# Patient Record
Sex: Male | Born: 1975 | Race: White | Hispanic: No | Marital: Married | Smoking: Never smoker
Health system: Southern US, Community
[De-identification: ages and names within clinical notes are randomized; demographics above are authoritative.]

## PROBLEM LIST (undated history)

## (undated) HISTORY — PX: BUNIONECTOMY: SHX129

## (undated) HISTORY — PX: HERNIA REPAIR: SHX51

## (undated) HISTORY — PX: TONSILLECTOMY: SUR1361

---

## 2005-03-12 ENCOUNTER — Emergency Department: Payer: Self-pay | Admitting: Emergency Medicine

## 2006-05-28 ENCOUNTER — Ambulatory Visit: Payer: Self-pay | Admitting: Family Medicine

## 2007-03-27 ENCOUNTER — Encounter (INDEPENDENT_AMBULATORY_CARE_PROVIDER_SITE_OTHER): Payer: Self-pay | Admitting: Internal Medicine

## 2007-03-27 ENCOUNTER — Ambulatory Visit: Payer: Self-pay | Admitting: Family Medicine

## 2007-03-28 ENCOUNTER — Ambulatory Visit: Payer: Self-pay | Admitting: Family Medicine

## 2007-03-29 LAB — CONVERTED CEMR LAB
Alkaline Phosphatase: 67 units/L (ref 39–117)
Bilirubin, Direct: 0.1 mg/dL (ref 0.0–0.3)
Mono Screen: NEGATIVE
Total Bilirubin: 0.8 mg/dL (ref 0.3–1.2)
Total Protein: 7.2 g/dL (ref 6.0–8.3)

## 2007-04-10 ENCOUNTER — Encounter (INDEPENDENT_AMBULATORY_CARE_PROVIDER_SITE_OTHER): Payer: Self-pay | Admitting: Internal Medicine

## 2007-05-13 ENCOUNTER — Ambulatory Visit: Payer: Self-pay | Admitting: Family Medicine

## 2007-05-13 DIAGNOSIS — K644 Residual hemorrhoidal skin tags: Secondary | ICD-10-CM | POA: Insufficient documentation

## 2007-07-12 LAB — CONVERTED CEMR LAB
Basophils Relative: 0.1 % (ref 0.0–1.0)
Eosinophils Absolute: 0.1 10*3/uL (ref 0.0–0.6)
Eosinophils Relative: 1.6 % (ref 0.0–5.0)
HCT: 45.9 % (ref 39.0–52.0)
Hemoglobin: 15.9 g/dL (ref 13.0–17.0)
Lymphocytes Relative: 39.8 % (ref 12.0–46.0)
MCV: 91 fL (ref 78.0–100.0)
Neutro Abs: 1.3 10*3/uL — ABNORMAL LOW (ref 1.4–7.7)
Neutrophils Relative %: 39.9 % — ABNORMAL LOW (ref 43.0–77.0)
Platelets: 250 10*3/uL (ref 150–400)
WBC: 3.3 10*3/uL — ABNORMAL LOW (ref 4.5–10.5)

## 2008-10-12 ENCOUNTER — Ambulatory Visit: Payer: Self-pay | Admitting: Family Medicine

## 2010-01-28 ENCOUNTER — Ambulatory Visit: Payer: Self-pay | Admitting: Family Medicine

## 2010-01-28 DIAGNOSIS — J019 Acute sinusitis, unspecified: Secondary | ICD-10-CM

## 2010-01-28 DIAGNOSIS — M549 Dorsalgia, unspecified: Secondary | ICD-10-CM | POA: Insufficient documentation

## 2010-03-10 ENCOUNTER — Telehealth: Payer: Self-pay | Admitting: Family Medicine

## 2010-11-15 NOTE — Assessment & Plan Note (Signed)
Summary: back and congestion/dlo   Vital Signs:  Patient profile:   35 year old male Height:      67.25 inches Weight:      158.25 pounds BMI:     24.69 Temp:     98.1 degrees F oral Pulse rate:   80 / minute Pulse rhythm:   regular BP sitting:   118 / 82  (left arm) Cuff size:   regular  Vitals Entered By: Lewanda Rife LPN (January 28, 2010 8:08 AM) CC: mid back pain below shoulder blades with head congestion and rt side of face hurts and teeth on rt side hurt, CHF Management   History of Present Illness: sinus symptoms started one week ago  runny nose and took claritin  got worse and worse- tried the D  now R side of face and teeth hurt and green nasal d/c started with cough-that is improved  no fever   also has back problem 3 weeks ago after 3rd shift went to bed  woke up with pain in mid back between shoulder blades had to crawl to the kitchen  took ibuprofen  progressively got better  now is just tight and worse on L  usually bending and extending  occ sore to take a really deep breath- that is a bit better this am    Allergies (verified): No Known Drug Allergies  Past History:  Social History: Last updated: 01/28/2010 Marital Status: Married--03/02/07 Children: 0--step daughter Occupation: Research officer, political party non smoker  Past Medical History: back pain  ext hemorroids   Social History: Marital Status: Married--03/02/07 Children: 0--step daughter Occupation: Research officer, political party non smoker  Review of Systems General:  Complains of fatigue and loss of appetite; denies chills and fever. Eyes:  Denies blurring and eye irritation. ENT:  Complains of earache, hoarseness, nasal congestion, postnasal drainage, sinus pressure, and sore throat. CV:  Denies chest pain or discomfort, lightheadness, and palpitations. Resp:  Denies cough and wheezing. GI:  Denies abdominal pain, change in bowel habits, indigestion, nausea, and vomiting. MS:  Complains of low back pain  and stiffness; denies joint swelling, loss of strength, cramps, and muscle weakness. Derm:  Denies itching, lesion(s), poor wound healing, and rash. Neuro:  Denies numbness, tingling, and weakness. Endo:  Denies excessive thirst and excessive urination. Heme:  Denies abnormal bruising and bleeding. Allergy:  Complains of seasonal allergies.  Physical Exam  General:  fatigued but well appearing  Head:   R max sinus tenderness normocephalic, atraumatic, and no abnormalities observed.   Eyes:  vision grossly intact, pupils equal, pupils round, pupils reactive to light, and no injection.   Ears:  R ear normal and L ear normal.   Nose:  nares are injected and congested bilaterally  Mouth:  pharynx pink and moist, no erythema, and no exudates.  with post nasal drip  Neck:  No deformities, masses, or tenderness noted. Chest Wall:  No deformities, masses, tenderness or gynecomastia noted. Lungs:  Normal respiratory effort, chest expands symmetrically. Lungs are clear to auscultation, no crackles or wheezes. Heart:  Normal rate and regular rhythm. S1 and S2 normal without gallop, murmur, click, rub or other extra sounds. Abdomen:  soft and non-tender.  no suprapubic tenderness or fullness felt  Msk:  nl rom neck and TS with no bony tenderness very tender in L  trap -- some pain to raise arm and twist  spasm noted  no LS pain / neg slr and nl rom hips  Extremities:  No clubbing, cyanosis,  edema, or deformity noted with normal full range of motion of all joints.   Neurologic:  strength normal in all extremities, sensation intact to light touch, gait normal, and DTRs symmetrical and normal.   Skin:  Intact without suspicious lesions or rashes Cervical Nodes:  No lymphadenopathy noted Psych:  normal affect, talkative and pleasant    Impression & Recommendations:  Problem # 1:  SINUSITIS - ACUTE-NOS (ICD-461.9) Assessment New s/p uri with facial pain and congestion tx with augmentin/ nasal  saline  update if not imp or if worse  The following medications were removed from the medication list:    Biaxin Xl 500 Mg Xr24h-tab (Clarithromycin) .Marland Kitchen... 2 by mouth once daily for 10 days    Guaifenesin-codeine 100-10 Mg/36ml Soln (Guaifenesin-codeine) .Marland Kitchen... 1-2 teaspoons by mouth up to 4-6 hours as needed cough His updated medication list for this problem includes:    Claritin-d 24 Hour 10-240 Mg Xr24h-tab (Loratadine-pseudoephedrine) .Marland Kitchen... Take 1 tablet by mouth once a day    Augmentin 875-125 Mg Tabs (Amoxicillin-pot clavulanate) .Marland Kitchen... 1 by mouth two times a day for 10 days  Problem # 2:  BACK PAIN (ICD-724.5) Assessment: New per exam - trapezius spasm that is improving with time recommended heat/ stretch/ibuprofen as needed  flexeril given with caution as needed  if not imp 1 wk consider PT or futher eval adv to update if any neurol symptoms  His updated medication list for this problem includes:    Ibuprofen 200 Mg Tabs (Ibuprofen) .Marland Kitchen... As needed    Flexeril 10 Mg Tabs (Cyclobenzaprine hcl) .Marland Kitchen... 1 by mouth up to three times a day as needed muscle spasm/ back pain  warn of sedation  Complete Medication List: 1)  Ibuprofen 200 Mg Tabs (Ibuprofen) .... As needed 2)  Claritin-d 24 Hour 10-240 Mg Xr24h-tab (Loratadine-pseudoephedrine) .... Take 1 tablet by mouth once a day 3)  Augmentin 875-125 Mg Tabs (Amoxicillin-pot clavulanate) .Marland Kitchen.. 1 by mouth two times a day for 10 days 4)  Flexeril 10 Mg Tabs (Cyclobenzaprine hcl) .Marland Kitchen.. 1 by mouth up to three times a day as needed muscle spasm/ back pain  warn of sedation  CHF Assessment/Plan:      The patient's current weight is 158.25 pounds.  His previous weight was 154 pounds.    Patient Instructions: 1)  continue claritin D if it helps 2)  use nasal saline spray or netti pot to flush sinuses  3)  use heat on back  4)  try flexeril - warn it may sedate  5)  ibuprofen is ok with food 6)  update me in 1 week if not improved or earlier if  worse 7)  I sent you px to pharmacy  Prescriptions: FLEXERIL 10 MG TABS (CYCLOBENZAPRINE HCL) 1 by mouth up to three times a day as needed muscle spasm/ back pain  warn of sedation  #30 x 0   Entered and Authorized by:   Judith Part MD   Signed by:   Judith Part MD on 01/28/2010   Method used:   Electronically to        [facility] [address] (retail)       [address]       [place], [place]  [address]       Ph: [phone]       Fax: [phone]   RxID:   [id number] AUGMENTIN 875-125 MG TABS (AMOXICILLIN-POT CLAVULANATE) 1 by mouth two times  a day for 10 days  #20 x 0   Entered and Authorized by:   Judith Part MD   Signed by:   Judith Part MD on 01/28/2010   Method used:   Electronically to        [facility] [address] (retail)       [address]       [place], [place]  [address]       Ph: [phone]       Fax: [phone]   RxID:   [id number]   Current Allergies (reviewed today): No known allergies

## 2010-11-15 NOTE — Progress Notes (Signed)
Summary: ? ABX refill  Phone Note Call from Patient Call back at Home Phone [phone]   Caller: Patient Call For: Dr. Milinda Antis Summary of Call: Patient says he was treated by you with Augmentin last month for a sinus infection.  He got better but still experienced some sinus pain and pressure from time to time.  Now, it is exacerbating again.  He is asking if you can refill the ABX.  I explained that it has been a month and that you likely will need him to be seen by someone here at the clinic but he asked that I send this note.  [facility] [place]  Phone:   [phone] Initial call taken by: Delilah Shan CMA Duncan Dull),  Mar 10, 2010 10:01 AM  Follow-up for Phone Call        if it has been a month- needs to be seen please  Follow-up by: Judith Part MD,  Mar 10, 2010 12:22 PM  Additional Follow-up for Phone Call Additional follow up Details #1::        Wife advised. Additional Follow-up by: Delilah Shan CMA Duncan Dull),  Mar 10, 2010 2:13 PM

## 2011-03-03 NOTE — Assessment & Plan Note (Signed)
[facility] OFFICE NOTE   Henry, [name]                        MRN:          [id number]  DATE:05/28/2006                            DOB:          [birth year]    Henry [name] is a 35 year old white male who comes to establish with the  practice due to upper respiratory symptoms, reflux, RLS, IBS, and weight  loss.   We discussed the problems that he wished to address first, the first being a  URI, onset approximately one week,  with fever and fatigue.  He has worsened  now with nasal congestion.  He has been taking Thera-flu which is of no  help.   He also indicates that he has had reflux, has not been using anything for  this except Gaviscon which brings very little relief.  He also indicates a  history of IBS and weight loss which will be addressed at other office visit  as will his question of restless legs.  He also has warts on both hands.   He has previously been seen at [facility] by Dr. Talmage Coin.  He is  switching practices.   CURRENT MEDICATIONS:  No chronic medications.  He is using Thera-Flu and  Gaviscon on an as needed basis.   ALLERGIES:  None known.   PAST MEDICAL HISTORY:  Significant for chickenpox, [place] measles and mumps,  reflux, and IBS.   SURGERIES:  Have included a repair of a right hernia between the ages of 3  and 4.  T&A between the ages of 3-6 and ear tubes as a younger child.  He  has not been hospitalized.   SOCIAL HISTORY:  He is engaged.  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  He is in no exercise program, has never smoked,  occasionally uses alcohol and does not use street drugs.   REVIEW OF SYSTEMS:  He denies any HEENT, cardiovascular, respiratory, GU  problems.  GI:  He has had IBS with diarrhea dominant.  No scoping.  He has  reflux which causes hoarseness and is having moderate improvement with  Gaviscon.  He has had no  transfusions and no tattoos.  He does note a 10  pound weight loss in the past year and indicates that he eats lots of food  and has for years.  MUSCULOSKELETAL:  He has a fracture of the right thumb  and questionable RLS.  He has some problems with allergies in the spring and  fall and no thromboses.   FAMILY HISTORY:  Father died at age 34 of cancer of the lung with  metastasis.  He had been a smoker.  He also had an MI at age 65 and had  bypass surgery.  Mother is living at age 50.  She had an MI at age 67, was a  smoker, hypertension, breast cancer, bypass surgery and depression.  His  paternal grandmother is living and is obese.  Paternal grandfather is living  at age 26 having had an MI and stent.  Both  of his maternal grandparents  have died, reason is not known.  He has one brother age 63 who died in a  motor vehicle accident.   Questions regarding the extended family found that a paternal grandfather  has had an MI and stents as noted above.  Diabetes in one maternal cousin.  To his knowledge there is no cancer, drug or alcohol abuse in the family.   IMMUNIZATION RECORDS:  His last tetanus was in 2006.  He has not had the  hepatitis B vaccine.   PHYSICAL EXAMINATION:  VITAL SIGNS:  Blood pressure 129/85.  Temperature  97.7.  Pulse is 62.  Weight is 148.  Height 5 feet 6-1/2 inches.  GENERAL:  Thin white male in no acute distress.  HEENT:  TMs are retracted bilaterally.  Fluid present.  Nasal mucosa is  erythematous and edematous.  Posterior pharynx is injected.  Sinuses are  minimally tender.  NECK:  Without lymphadenopathy.  CHEST:  Clear throughout.  No rales or rhonchi or wheezes.  HEART:  Rate and rhythm regular without murmurs, gallops or rubs.  ABDOMEN:  Soft and flat without masses.  He has minimal tenderness in the  mid-epigastric region.  No organomegaly including liver and spleen.  Bowel  sounds are heard throughout.  RECTAL:  Deferred at this time.   MUSCULOSKELETAL:  Muscle mass is equal upper and lower extremities.  Strength is 5/5 upper and lower extremities.  Gait and station within normal  limits.  SKIN:  Without obvious lesion in exposed areas.  PSYCHIATRIC:  Oriented x3.  Verbalizes easily.   ASSESSMENT:  1. Upper respiratory infection.  Will try him on Clarinex 5 mg one a day.      Samples were given and then move to Claritin over-the-counter if he      needs any more coverage.  Increase p.o. fluids and see back if not      improved in 7-10 days.  2. Gastroesophageal reflux disease.  I have asked him to move to Prilosec      20 mg one a day.  I have given him samples and he will obtain it over-      the-counter.  If he is not improved he will increase it to 40 mg a day.  3. History of irritable bowel syndrome which he indicates is stable at the      present time.  4. Restless leg syndrome which was not discussed today but will when he      returns.  5. Warts on his hands.  Will treat these on the next visit.                                  Billie D. Jillyn Hidden, FNP                                Arta Silence, MD   BDB/MedQ  DD:  06/04/2006  DT:  06/04/2006  Job #:  [id number]

## 2013-05-18 ENCOUNTER — Telehealth: Payer: Self-pay | Admitting: Family Medicine

## 2013-05-18 DIAGNOSIS — Z0000 Encounter for general adult medical examination without abnormal findings: Secondary | ICD-10-CM | POA: Insufficient documentation

## 2013-05-18 NOTE — Telephone Encounter (Signed)
Message copied by Judy Pimple on Sun May 18, 2013 11:30 PM ------      Message from: Alvina Chou      Created: Tue May 06, 2013 11:25 AM      Regarding: Lab orders for Monday, 8.4.14       Patient is scheduled for CPX labs, please order future labs, Thanks , Terri       ------

## 2013-05-19 ENCOUNTER — Other Ambulatory Visit (INDEPENDENT_AMBULATORY_CARE_PROVIDER_SITE_OTHER): Payer: BC Managed Care – PPO

## 2013-05-19 DIAGNOSIS — R7989 Other specified abnormal findings of blood chemistry: Secondary | ICD-10-CM

## 2013-05-19 DIAGNOSIS — Z0000 Encounter for general adult medical examination without abnormal findings: Secondary | ICD-10-CM

## 2013-05-19 LAB — CBC WITH DIFFERENTIAL/PLATELET
Basophils Absolute: 0 10*3/uL (ref 0.0–0.1)
HCT: 42.9 % (ref 39.0–52.0)
Lymphs Abs: 2.5 10*3/uL (ref 0.7–4.0)
MCV: 91.2 fl (ref 78.0–100.0)
Monocytes Absolute: 0.7 10*3/uL (ref 0.1–1.0)
Monocytes Relative: 7.4 % (ref 3.0–12.0)
Neutrophils Relative %: 65 % (ref 43.0–77.0)
Platelets: 253 10*3/uL (ref 150.0–400.0)
RDW: 12.5 % (ref 11.5–14.6)

## 2013-05-19 LAB — COMPREHENSIVE METABOLIC PANEL
ALT: 31 U/L (ref 0–53)
Alkaline Phosphatase: 67 U/L (ref 39–117)
Potassium: 3.8 mEq/L (ref 3.5–5.1)
Sodium: 138 mEq/L (ref 135–145)
Total Bilirubin: 0.8 mg/dL (ref 0.3–1.2)
Total Protein: 7.6 g/dL (ref 6.0–8.3)

## 2013-05-19 LAB — LIPID PANEL
Cholesterol: 206 mg/dL — ABNORMAL HIGH (ref 0–200)
HDL: 41.6 mg/dL (ref >39.00)
Total CHOL/HDL Ratio: 5
Triglycerides: 170 mg/dL — ABNORMAL HIGH (ref 0.0–149.0)
VLDL: 34 mg/dL (ref 0.0–40.0)

## 2013-05-26 ENCOUNTER — Encounter: Payer: Self-pay | Admitting: Family Medicine

## 2013-05-26 ENCOUNTER — Ambulatory Visit (INDEPENDENT_AMBULATORY_CARE_PROVIDER_SITE_OTHER): Payer: BC Managed Care – PPO | Admitting: Family Medicine

## 2013-05-26 VITALS — BP 118/78 | HR 66 | Temp 98.4°F | Ht 67.25 in | Wt 173.5 lb

## 2013-05-26 DIAGNOSIS — E785 Hyperlipidemia, unspecified: Secondary | ICD-10-CM

## 2013-05-26 DIAGNOSIS — Z0000 Encounter for general adult medical examination without abnormal findings: Secondary | ICD-10-CM

## 2013-05-26 NOTE — Assessment & Plan Note (Signed)
Reviewed health habits including diet and exercise and skin cancer prevention Also reviewed health mt list, fam hx and immunizations  Rev wellness labs in detail  Rev some changes in diet that would be helpful ie: getting rid of sugar beverages
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
## 2013-05-26 NOTE — Assessment & Plan Note (Signed)
Disc goals for lipids and reasons to control them Rev labs with pt Rev low sat fat diet in detail   

## 2013-05-26 NOTE — Patient Instructions (Addendum)
You have mild high cholesterol Avoid red meat/ fried foods/ egg yolks/ fatty breakfast meats/ butter, cheese and high fat dairy/ and shellfish   Also get as much exercise as you can and try to lay off the sugar drinks and increase water intake Get a flu shot every fall

## 2013-08-21 ENCOUNTER — Other Ambulatory Visit: Payer: Self-pay

## 2014-10-30 ENCOUNTER — Encounter: Payer: Self-pay | Admitting: Podiatry

## 2014-10-30 ENCOUNTER — Ambulatory Visit (INDEPENDENT_AMBULATORY_CARE_PROVIDER_SITE_OTHER): Payer: BLUE CROSS/BLUE SHIELD | Admitting: Podiatry

## 2014-10-30 ENCOUNTER — Ambulatory Visit (INDEPENDENT_AMBULATORY_CARE_PROVIDER_SITE_OTHER): Payer: BLUE CROSS/BLUE SHIELD

## 2014-10-30 VITALS — BP 130/83 | HR 64 | Resp 16 | Ht 69.0 in | Wt 170.0 lb

## 2014-10-30 DIAGNOSIS — M21612 Bunion of left foot: Secondary | ICD-10-CM

## 2014-10-30 DIAGNOSIS — M2012 Hallux valgus (acquired), left foot: Secondary | ICD-10-CM

## 2014-10-30 DIAGNOSIS — M779 Enthesopathy, unspecified: Secondary | ICD-10-CM

## 2014-10-30 NOTE — Progress Notes (Signed)
Subjective:     Patient ID: Henry [name], male   DOB: 10/05/1976, 39 y.o.   MRN: [id number]  HPI patient presents with very painful bunion deformity of the left foot of close two-year duration with significant family history of this condition and his mother having both bunions fixed by me in the last few years. States he's having trouble wearing shoes or being able to ambulate and is also walking differently and is developing pain on the outside of his foot   Review of Systems  All other systems reviewed and are negative.      Objective:   Physical Exam  Constitutional: He is oriented to person, place, and time.  Cardiovascular: Intact distal pulses.   Musculoskeletal: Normal range of motion.  Neurological: He is oriented to person, place, and time.  Skin: Skin is warm.  Nursing note and vitals reviewed.  neurovascular status found to be intact with muscle strength adequate and range of motion subtalar midtarsal joint within normal limits. 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 Due to long-standing nature deformity increased pain and significant family history I do think that this would be best taken care of surgically and I reviewed Austin osteotomy left to be performed. Patient wants surgery and wants to do consent form now due to his schedule and at this time I allowed him to read a consent form line byline explaining surgery and all possible complications as outlined. He understands  risk wants surgery signed consent form and is given preoperative instructions sheet along with cam walker that I want him to get used to prior to surgery. He is encouraged to call if she stepped any questions prior to the procedure and also I discussed the outside of the foot which is most likely related to change in gait which should settle down once the bunion is correct

## 2014-10-30 NOTE — Progress Notes (Signed)
   Subjective:    Patient ID: Henry [name], male    DOB: 03/26/1976, 39 y.o.   MRN: [id number]  HPI Comments: i have a bunion on my left foot. Both sides of the left foot hurt. Its been hurting for 6-8 months. Its getting worse. i have bought some something off line to straighten my toe up. It hurts to walk, stand, and wear shoes sometimes.   Foot Pain      Review of Systems  All other systems reviewed and are negative.      Objective:   Physical Exam        Assessment & Plan:

## 2014-11-05 DIAGNOSIS — M2012 Hallux valgus (acquired), left foot: Secondary | ICD-10-CM

## 2014-11-24 DIAGNOSIS — M2012 Hallux valgus (acquired), left foot: Secondary | ICD-10-CM

## 2014-12-01 ENCOUNTER — Ambulatory Visit (INDEPENDENT_AMBULATORY_CARE_PROVIDER_SITE_OTHER): Payer: BLUE CROSS/BLUE SHIELD

## 2014-12-01 ENCOUNTER — Encounter: Payer: BLUE CROSS/BLUE SHIELD | Admitting: Podiatry

## 2014-12-01 ENCOUNTER — Encounter: Payer: Self-pay | Admitting: Podiatry

## 2014-12-01 ENCOUNTER — Ambulatory Visit (INDEPENDENT_AMBULATORY_CARE_PROVIDER_SITE_OTHER): Payer: BLUE CROSS/BLUE SHIELD | Admitting: Podiatry

## 2014-12-01 VITALS — BP 133/89 | HR 78 | Resp 16

## 2014-12-01 DIAGNOSIS — M2012 Hallux valgus (acquired), left foot: Secondary | ICD-10-CM

## 2014-12-02 NOTE — Progress Notes (Signed)
Subjective:     Patient ID: Henry [name], male   DOB: 01-Nov-1975, 39 y.o.   MRN: [id number]  HPI patient states that I'm doing really well with my foot with minimal discomfort and able to walk without swelling or minimal pain   Review of Systems     Objective:   Physical Exam Neurovascular status intact with muscle strength adequate range of motion within normal limits and noted to have well-healing first metatarsal site with wound edges well coapted and good alignment of the first metatarsal    Assessment:     Doing well post Austin osteotomy of the left foot with pin fixation    Plan:     H&P and conditions discussed. Today I went ahead and reviewed x-ray and advised on continued immobilization compression and elevation. Reappoint in 3 weeks or earlier if any issues should occur. Reapplied sterile dressing

## 2014-12-04 ENCOUNTER — Encounter: Payer: BLUE CROSS/BLUE SHIELD | Admitting: Podiatry

## 2014-12-25 ENCOUNTER — Encounter: Payer: Self-pay | Admitting: Podiatry

## 2014-12-25 ENCOUNTER — Ambulatory Visit (INDEPENDENT_AMBULATORY_CARE_PROVIDER_SITE_OTHER): Payer: BLUE CROSS/BLUE SHIELD | Admitting: Podiatry

## 2014-12-25 ENCOUNTER — Ambulatory Visit (INDEPENDENT_AMBULATORY_CARE_PROVIDER_SITE_OTHER): Payer: BLUE CROSS/BLUE SHIELD

## 2014-12-25 VITALS — BP 126/69 | HR 84 | Resp 16

## 2014-12-25 DIAGNOSIS — M2012 Hallux valgus (acquired), left foot: Secondary | ICD-10-CM

## 2014-12-25 DIAGNOSIS — R609 Edema, unspecified: Secondary | ICD-10-CM

## 2014-12-25 NOTE — Progress Notes (Signed)
Subjective:     Patient ID: Henry [name], male   DOB: 09-Jul-1976, 39 y.o.   MRN: [id number]  HPI patient presents stating that he is doing well but still getting some swelling and he's not sure about the range of motion. He still wearing his surgical shoe but is able to walk without significant discomfort   Review of Systems     Objective:   Physical Exam Neurovascular status intact with muscle strength adequate range of motion within normal limits. Patient's noted to have good healing incision site left first metatarsal with wound edges coapted well and hallux in rectus position. There is mild restriction of dorsiflexion but it is adequate at this time for this. Postop    Assessment:     Doing well with continued healing still progressing    Plan:     Reviewed condition and at this time dispensed a above ankle Tri-Lock brace in order to provide edema control and then I connected it to a distal device to lower the hallux and keep it stretched area reappoint to recheck

## 2015-01-22 ENCOUNTER — Ambulatory Visit (INDEPENDENT_AMBULATORY_CARE_PROVIDER_SITE_OTHER): Payer: BLUE CROSS/BLUE SHIELD

## 2015-01-22 ENCOUNTER — Ambulatory Visit (INDEPENDENT_AMBULATORY_CARE_PROVIDER_SITE_OTHER): Payer: BLUE CROSS/BLUE SHIELD | Admitting: Podiatry

## 2015-01-22 ENCOUNTER — Encounter: Payer: Self-pay | Admitting: Podiatry

## 2015-01-22 VITALS — BP 135/84 | HR 78 | Resp 16

## 2015-01-22 DIAGNOSIS — M2012 Hallux valgus (acquired), left foot: Secondary | ICD-10-CM

## 2015-01-24 NOTE — Progress Notes (Signed)
Subjective:     Patient ID: Henry BentonJames O Crafts II, male   DOB: 1976/01/28, 39 y.o.   MRN: [id number]  HPI patient states I get the shocking sensations the time but overall my foot is doing well and the pins are intact and the structural alignment looks good   Review of Systems     Objective:   Physical Exam Neurovascular status intact negative Homans sign noted with excellent alignment of the left forefoot secondary to reconstruction. Pins intact in the lesser digits and the toes are straight and not elevated and the wound edges are all well coapted on the foot    Assessment:     Doing well post forefoot reconstruction left    Plan:     Reviewed x-rays and remove pins from the toes and applied sterile dressings. Continue with range of motion exercises and gradual return soft shoe gear in the next week and dispensed anklet with instructions on usage. Reappoint one month and less she needs to be seen earlier

## 2015-03-25 ENCOUNTER — Telehealth: Payer: Self-pay | Admitting: *Deleted

## 2015-03-25 NOTE — Telephone Encounter (Signed)
no

## 2015-03-25 NOTE — Telephone Encounter (Addendum)
Pt states he had a bunion procedure 3 months ago and was wondering if he needed to be seen for the last appt.  Pt states he is doing a great.  I left message informing pt of Dr. Beverlee Nims statement.

## 2015-03-26 ENCOUNTER — Ambulatory Visit: Payer: BLUE CROSS/BLUE SHIELD | Admitting: Podiatry

## 2015-04-23 ENCOUNTER — Ambulatory Visit (INDEPENDENT_AMBULATORY_CARE_PROVIDER_SITE_OTHER): Payer: BLUE CROSS/BLUE SHIELD | Admitting: Family Medicine

## 2015-04-23 ENCOUNTER — Encounter: Payer: Self-pay | Admitting: Family Medicine

## 2015-04-23 ENCOUNTER — Ambulatory Visit (INDEPENDENT_AMBULATORY_CARE_PROVIDER_SITE_OTHER)
Admission: RE | Admit: 2015-04-23 | Discharge: 2015-04-23 | Disposition: A | Discharge status: Home / Self Care | Payer: BLUE CROSS/BLUE SHIELD | Source: Ambulatory Visit | Attending: Family Medicine | Admitting: Family Medicine

## 2015-04-23 VITALS — BP 136/74 | HR 60 | Temp 98.3°F | Ht 67.25 in | Wt 177.0 lb

## 2015-04-23 DIAGNOSIS — J209 Acute bronchitis, unspecified: Secondary | ICD-10-CM | POA: Insufficient documentation

## 2015-04-23 MED ORDER — PREDNISONE 10 MG PO TABS: ORAL_TABLET | ORAL | Status: DC

## 2015-04-23 MED ORDER — ALBUTEROL SULFATE HFA 108 (90 BASE) MCG/ACT IN AERS: 2.0000 | INHALATION_SPRAY | RESPIRATORY_TRACT | Status: DC | PRN

## 2015-04-23 MED ORDER — BENZONATATE 200 MG PO CAPS: 200.0000 mg | ORAL_CAPSULE | Freq: Three times a day (TID) | ORAL | Status: DC | PRN

## 2015-04-23 NOTE — Progress Notes (Signed)
Subjective:    Patient ID: Henry [name], male    DOB: [birth year], 39 y.o.   MRN: [id number]  HPI Here with uri symptoms  Started 4 weeks ago   First day - he had a fever and malaise and no appetite  Night sweats and chills Then nagging dry cough -- non productive  (was at the beach)  Felt lousy -one day to the next  Went to the urgent care 6/29 -- (son was sick too)   Given zpak  Dx with bronchitis  Did not get a very complete examination (only listened to lungs)   Chest hurts in one spot - esp with cough and sneezing (under R breast area)  Tight in his chest (perhaps a bit of wheezing at night)  Taking 800 mg ibuprofen  tussionex for cough - helped for 2 days (knocks him out and then cannot go back to sleep)   Still has a cough- calming down a bit   Son still has chest congestion - given prednisone and albuterol   Not having any nasal or ear or throat symptoms   Of note has had a leak in pipes at home with wet carpet etc. (3 weeks)  ? If poss mold    Patient Active Problem List   Diagnosis Date Noted  . Acute bronchitis 04/23/2015  . 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 Routine general medical examination at a health care facility 05/18/2013  . HEMORRHOIDS, EXTERNAL 05/13/2007   No past medical history on file. No past surgical history on file. History  Substance Use Topics  . Smoking status: Never Smoker   . Smokeless tobacco: Not on file  . Alcohol Use: 0.0 oz/week    0 Standard drinks or equivalent per week     Comment: weekly   No family history on file. No Known Allergies No current outpatient prescriptions on file prior to visit.   No current facility-administered medications on file prior to visit.    Review of Systems    Review of Systems  Constitutional: Negative for fever, appetite change,  and unexpected weight change.  Eyes: Negative for pain and visual disturbance.  ENT pos for rhinorrhea and drip  Respiratory: pos for cough/ tight  chest and wheeze  pos for chest wall pain  Cardiovascular: Negative for  palpitations    Gastrointestinal: Negative for nausea, diarrhea and constipation.  Genitourinary: Negative for urgency and frequency.  Skin: Negative for pallor or rash   Neurological: Negative for weakness, light-headedness, numbness and headaches.  Hematological: Negative for adenopathy. Does not bruise/bleed easily.  Psychiatric/Behavioral: Negative for dysphoric mood. The patient is not nervous/anxious.      Objective:   Physical Exam  Constitutional: He appears well-developed and well-nourished. No distress.  HENT:  Head: Normocephalic and atraumatic.  Right Ear: External ear normal.  Left Ear: External ear normal.  Mouth/Throat: Oropharynx is clear and moist.  Nares are boggy No sinus tenderness Clear rhinorrhea and post nasal drip   Eyes: Conjunctivae and EOM are normal. Pupils are equal, round, and reactive to light. Right eye exhibits no discharge. Left eye exhibits no discharge.  Neck: Normal range of motion. Neck supple.  Cardiovascular: Normal rate and normal heart sounds.   Pulmonary/Chest: Effort normal. No respiratory distress. He has wheezes. He has no rales. He exhibits tenderness.  Harsh bs Some end exp wheezes-though air exch is overall good Hacking cough  Tender CW under R breast area w/o crepitus or skin change   Lymphadenopathy:  He has no cervical adenopathy.  Neurological: He is alert.  Skin: Skin is warm and dry. No rash noted.  Psychiatric: He has a normal mood and affect.          Assessment & Plan:   Problem List Items Addressed This Visit    Acute bronchitis - Primary    With cyclic cough Will tx with hydrocodone cough med and tessalon to stop cycle  Albuterol prn for wheeze and prednisone for inflammation/ tightness Will watch out for fever or more prod cough  Update if not starting to improve in a week or if worsening        Relevant Orders   DG Chest 2 View  (Completed)

## 2015-04-23 NOTE — Progress Notes (Signed)
Pre visit review using our clinic review tool, if applicable. No additional management support is needed unless otherwise documented below in the visit note. 

## 2015-04-23 NOTE — Patient Instructions (Signed)
Try tessalon for cough three times daily  You can still take the hydrocodone cough syrup  Take the prednisone as directed - for inflammation in chest/lungs and tightness (this will also help pain)  Try a warm compress on chest also  Albuterol inhaler as needed for wheeze /tight chest - see the instructions I printed

## 2015-04-25 NOTE — Assessment & Plan Note (Signed)
With cyclic cough Will tx with hydrocodone cough med and tessalon to stop cycle  Albuterol prn for wheeze and prednisone for inflammation/ tightness Will watch out for fever or more prod cough  Update if not starting to improve in a week or if worsening

## 2016-03-06 IMAGING — CR DG CHEST 2V
2 series · 2 of 2 positions shown · non-contrast
Comparison: None.

CLINICAL DATA: Nonproductive cough and chest pain for 4 weeks.

EXAM:
CHEST  2 VIEW

[view not recorded (1 of 2)]
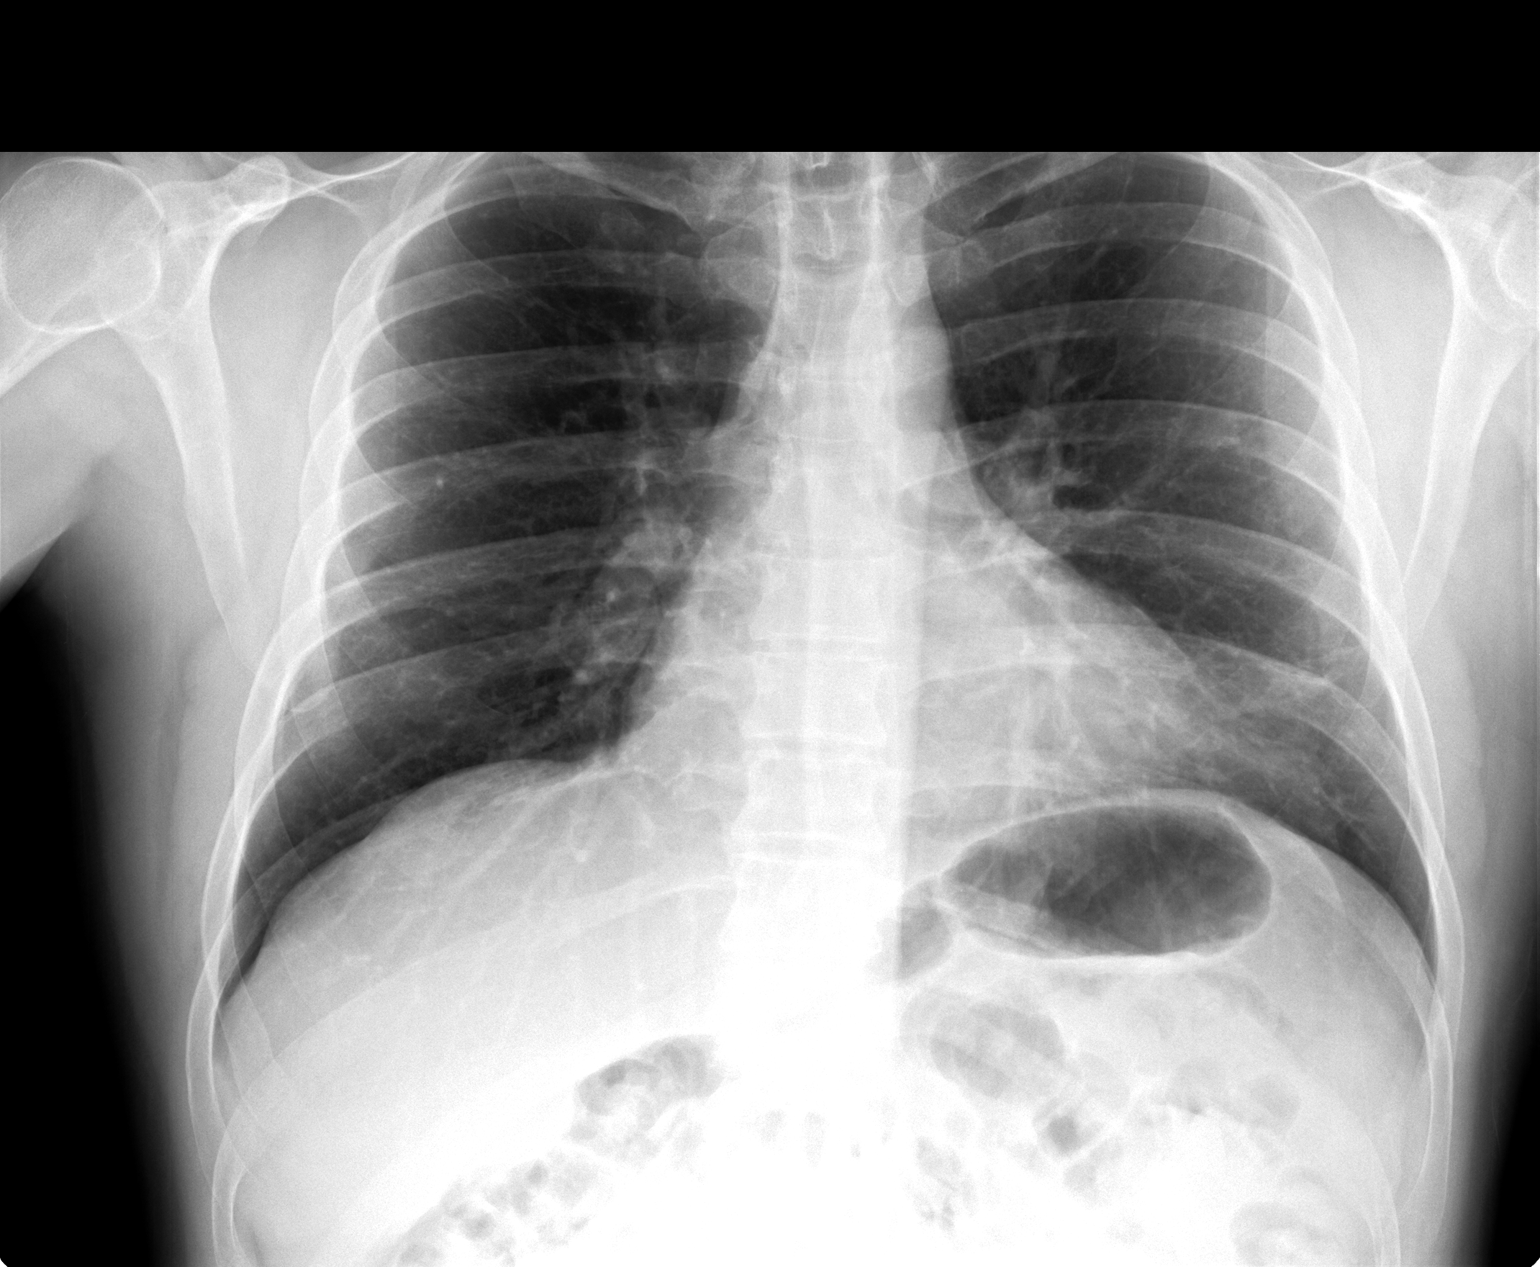

[view not recorded (2 of 2)]
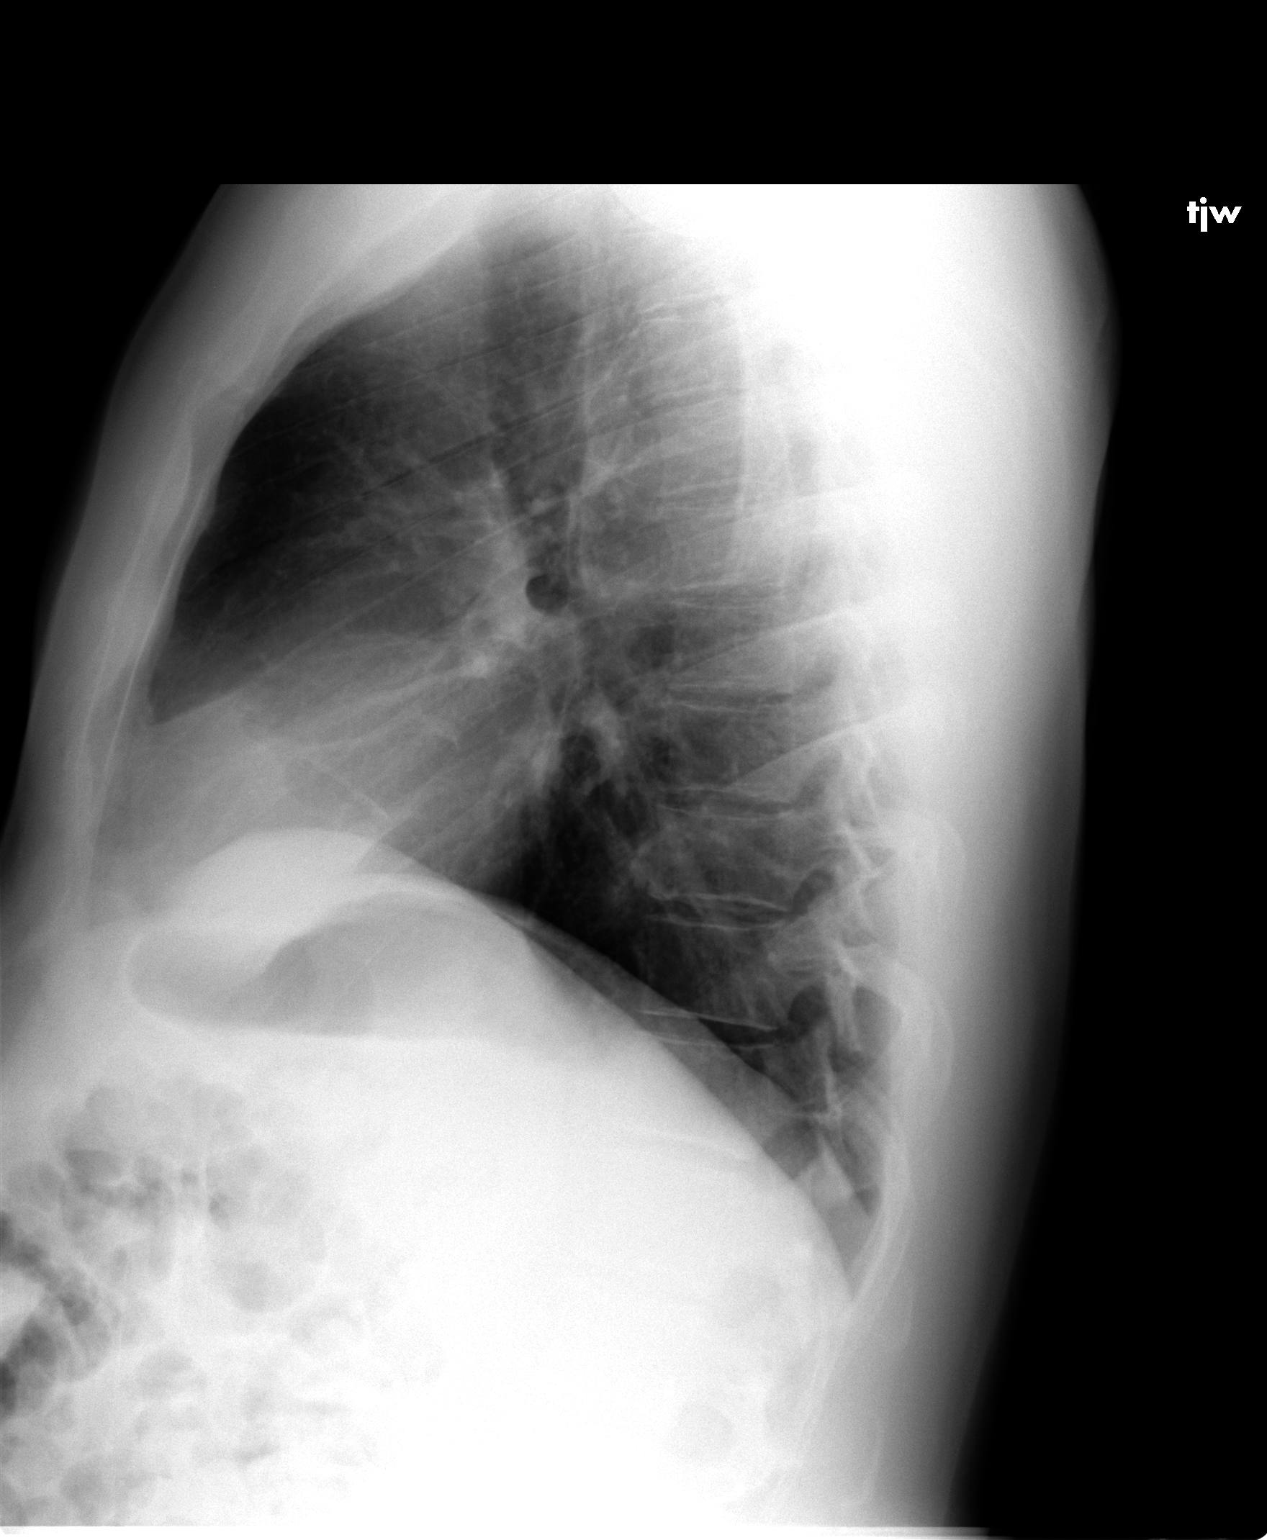

[2 of 2 positions shown; findings below may reference images not displayed]

FINDINGS: The cardiomediastinal silhouette is unremarkable.

Subsegmental atelectasis/scar in the left lower lung noted.

There is no evidence of focal airspace disease, pulmonary edema,
suspicious pulmonary nodule/mass, pleural effusion, or pneumothorax.
No acute bony abnormalities are identified.
IMPRESSION: No acute cardiopulmonary disease.

Subsegmental left lower lung atelectasis/scar.

## 2017-03-29 ENCOUNTER — Ambulatory Visit (INDEPENDENT_AMBULATORY_CARE_PROVIDER_SITE_OTHER): Payer: BLUE CROSS/BLUE SHIELD | Admitting: Family Medicine

## 2017-03-29 ENCOUNTER — Encounter: Payer: Self-pay | Admitting: Family Medicine

## 2017-03-29 VITALS — BP 124/66 | HR 63 | Temp 98.5°F | Wt 185.5 lb

## 2017-03-29 DIAGNOSIS — R05 Cough: Secondary | ICD-10-CM

## 2017-03-29 DIAGNOSIS — R059 Cough, unspecified: Secondary | ICD-10-CM

## 2017-03-29 MED ORDER — GUAIFENESIN-CODEINE 100-10 MG/5ML PO SYRP: 5.0000 mL | ORAL_SOLUTION | Freq: Two times a day (BID) | ORAL | 0 refills | Status: DC | PRN

## 2017-03-29 MED ORDER — FLUTICASONE PROPIONATE 50 MCG/ACT NA SUSP: 2.0000 | Freq: Every day | NASAL | 1 refills | Status: DC

## 2017-03-29 NOTE — Patient Instructions (Signed)
No signs of infection I think you have either allergic cough or reflux cough Treat with cheratussin codeine syrup mainly at night time.  Start flonase nasal spray in the morning for next 1-2 weeks, start zantac (ranitidine) 150mg  nightly for next 1-2 weeks.  If ongoing despite this let us know, or worsening fever >101 or productive cough.

## 2017-03-29 NOTE — Assessment & Plan Note (Signed)
Anticipate GERD related or allergic cough. Discussed with patient. rec ranitidine 150mg  nightly x 2wks, flonase daily x 2wks. Rx cheratussin for night time cough. Update if not improving with treatment. Pt agrees with plan.

## 2017-03-29 NOTE — Progress Notes (Signed)
BP 124/66   Pulse 63   Temp 98.5 F (36.9 C) (Oral)   Wt 185 lb 8 oz (84.1 kg)   SpO2 97%   BMI 28.84 kg/m    CC: cough Subjective:    Patient ID: Henry [name], male    DOB: August 25, 1976, 41 y.o.   MRN: [id number]  HPI: Henry [name] is a 41 y.o. male presenting on 03/29/2017 for Cough   Last seen by PCP 2016. 1 wk h/o nagging nonproductive cough, worse in evenings. Kept him up all last night. Trouble sleeping due to cough. Mild wheeze present at end expiration but no dyspnea.   No fevers/chills, ear or tooth pain, ST, PNdrainage, congestion.  Recent trip to [place] for work.  Treated with honey as well as sinus/allergy OTC remedy.  No sick contacts at home.  No smokers at home. No h/o asthma.   He does have some GERD sxs. He does have h/o seasonal allergic rhinitis (usually treated with local honey).   Relevant past medical, surgical, family and social history reviewed and updated as indicated. Interim medical history since our last visit reviewed. Allergies and medications reviewed and updated. Outpatient Medications Prior to Visit  Medication Sig Dispense Refill  . ibuprofen (ADVIL,MOTRIN) 800 MG tablet Take 800 mg by mouth 3 (three) times daily.  0  . albuterol (PROVENTIL HFA;VENTOLIN HFA) 108 (90 BASE) MCG/ACT inhaler Inhale 2 puffs into the lungs every 4 (four) hours as needed for wheezing or shortness of breath. 1 Inhaler 0  . benzonatate (TESSALON) 200 MG capsule Take 1 capsule (200 mg total) by mouth 3 (three) times daily as needed for cough (swallow whole, do not bite pill). 30 capsule 1  . predniSONE (DELTASONE) 10 MG tablet Take 3 pills once daily by mouth for 3 days, then 2 pills once daily for 3 days, then 1 pill once daily for 3 days and then stop 18 tablet 0   No facility-administered medications prior to visit.      Per HPI unless specifically indicated in ROS section below Review of Systems     Objective:    BP 124/66   Pulse 63   Temp  98.5 F (36.9 C) (Oral)   Wt 185 lb 8 oz (84.1 kg)   SpO2 97%   BMI 28.84 kg/m   Wt Readings from Last 3 Encounters:  03/29/17 185 lb 8 oz (84.1 kg)  04/23/15 177 lb (80.3 kg)  10/30/14 170 lb (77.1 kg)    Physical Exam  Constitutional: He appears well-developed and well-nourished. No distress.  HENT:  Head: Normocephalic and atraumatic.  Right Ear: Hearing, tympanic membrane, external ear and ear canal normal.  Left Ear: Hearing, tympanic membrane, external ear and ear canal normal.  Nose: Nose normal. No mucosal edema or rhinorrhea. Right sinus exhibits no maxillary sinus tenderness and no frontal sinus tenderness. Left sinus exhibits no maxillary sinus tenderness and no frontal sinus tenderness.  Mouth/Throat: Uvula is midline, oropharynx is clear and moist and mucous membranes are normal. No oropharyngeal exudate, posterior oropharyngeal edema, posterior oropharyngeal erythema or tonsillar abscesses.  Eyes: Conjunctivae and EOM are normal. Pupils are equal, round, and reactive to light. No scleral icterus.  Neck: Normal range of motion. Neck supple.  Cardiovascular: Normal rate, regular rhythm, normal heart sounds and intact distal pulses.   No murmur heard. Pulmonary/Chest: Effort normal and breath sounds normal. No respiratory distress. He has no wheezes. He has no rales.  Lymphadenopathy:  He has no cervical adenopathy.  Skin: Skin is warm and dry. No rash noted.  Nursing note and vitals reviewed.      Assessment & Plan:   Problem List Items Addressed This Visit    Cough    Anticipate GERD related or allergic cough. Discussed with patient. rec ranitidine 150mg  nightly x 2wks, flonase daily x 2wks. Rx cheratussin for night time cough. Update if not improving with treatment. Pt agrees with plan.           Follow up plan: Return if symptoms worsen or fail to improve.  Eustaquio BoydenJavier Wane Mollett, MD

## 2017-07-27 DIAGNOSIS — K3580 Unspecified acute appendicitis: Secondary | ICD-10-CM | POA: Diagnosis not present

## 2017-07-27 DIAGNOSIS — E785 Hyperlipidemia, unspecified: Secondary | ICD-10-CM | POA: Diagnosis not present

## 2017-07-27 DIAGNOSIS — Z791 Long term (current) use of non-steroidal anti-inflammatories (NSAID): Secondary | ICD-10-CM | POA: Insufficient documentation

## 2017-07-27 LAB — COMPREHENSIVE METABOLIC PANEL
ALBUMIN: 4.7 g/dL (ref 3.5–5.0)
ALK PHOS: 92 U/L (ref 38–126)
ALT: 43 U/L (ref 17–63)
ANION GAP: 10 (ref 5–15)
AST: 27 U/L (ref 15–41)
BILIRUBIN TOTAL: 0.9 mg/dL (ref 0.3–1.2)
BUN: 13 mg/dL (ref 6–20)
CALCIUM: 9.4 mg/dL (ref 8.9–10.3)
CO2: 25 mmol/L (ref 22–32)
Chloride: 103 mmol/L (ref 101–111)
Creatinine, Ser: 1.06 mg/dL (ref 0.61–1.24)
Glucose, Bld: 111 mg/dL — ABNORMAL HIGH (ref 65–99)
POTASSIUM: 4.1 mmol/L (ref 3.5–5.1)
Sodium: 138 mmol/L (ref 135–145)
TOTAL PROTEIN: 8 g/dL (ref 6.5–8.1)

## 2017-07-27 LAB — CBC
HEMATOCRIT: 45.8 % (ref 40.0–52.0)
HEMOGLOBIN: 15.5 g/dL (ref 13.0–18.0)
MCH: 30.2 pg (ref 26.0–34.0)
MCHC: 33.9 g/dL (ref 32.0–36.0)
MCV: 89 fL (ref 80.0–100.0)
Platelets: 252 10*3/uL (ref 150–440)
RBC: 5.14 MIL/uL (ref 4.40–5.90)
RDW: 13.1 % (ref 11.5–14.5)
WBC: 11 10*3/uL — ABNORMAL HIGH (ref 3.8–10.6)

## 2017-07-27 LAB — LIPASE, BLOOD: Lipase: 25 U/L (ref 11–51)

## 2017-07-27 NOTE — ED Triage Notes (Signed)
Pt arrives to ED with c/o RLQ abdominal pain that began Wednesday morning. States nausea. Denies vomiting, diarrhea, fever. Pt alert, oriented, ambulatory. Still has all belly organs.

## 2017-07-28 ENCOUNTER — Observation Stay: Payer: BLUE CROSS/BLUE SHIELD | Admitting: Anesthesiology

## 2017-07-28 ENCOUNTER — Encounter
Admission: EM | Disposition: A | Discharge status: Home / Self Care | Payer: Self-pay | Source: Home / Self Care | Attending: Emergency Medicine

## 2017-07-28 ENCOUNTER — Observation Stay
Admission: EM | Admit: 2017-07-28 | Discharge: 2017-07-29 | Disposition: A | Discharge status: Home / Self Care | Payer: BLUE CROSS/BLUE SHIELD | Attending: Surgery | Admitting: Surgery

## 2017-07-28 ENCOUNTER — Emergency Department: Payer: BLUE CROSS/BLUE SHIELD

## 2017-07-28 DIAGNOSIS — R1031 Right lower quadrant pain: Secondary | ICD-10-CM

## 2017-07-28 DIAGNOSIS — K3580 Unspecified acute appendicitis: Secondary | ICD-10-CM | POA: Diagnosis present

## 2017-07-28 DIAGNOSIS — K353 Acute appendicitis with localized peritonitis, without perforation or gangrene: Secondary | ICD-10-CM | POA: Diagnosis not present

## 2017-07-28 HISTORY — PX: LAPAROSCOPIC APPENDECTOMY: SHX408

## 2017-07-28 LAB — URINALYSIS, COMPLETE (UACMP) WITH MICROSCOPIC
Bilirubin Urine: NEGATIVE
Glucose, UA: NEGATIVE mg/dL
KETONES UR: 5 mg/dL — AB
Leukocytes, UA: NEGATIVE
Nitrite: NEGATIVE
PROTEIN: NEGATIVE mg/dL
Specific Gravity, Urine: 1.017 (ref 1.005–1.030)
pH: 5 (ref 5.0–8.0)

## 2017-07-28 SURGERY — APPENDECTOMY, LAPAROSCOPIC
Anesthesia: General

## 2017-07-28 MED ORDER — FAMOTIDINE 20 MG PO TABS
20.0000 mg | ORAL_TABLET | Freq: Once | ORAL | Status: AC
  Administered 2017-07-28: 20 mg via ORAL
  Filled 2017-07-28: qty 1

## 2017-07-28 MED ORDER — FENTANYL CITRATE (PF) 100 MCG/2ML IJ SOLN
25.0000 ug | INTRAMUSCULAR | Status: DC | PRN
  Administered 2017-07-28 (×2): 25 ug via INTRAVENOUS

## 2017-07-28 MED ORDER — HYDROCODONE-ACETAMINOPHEN 5-325 MG PO TABS
1.0000 | ORAL_TABLET | ORAL | Status: DC | PRN
Start: 2017-07-28 — End: 2017-07-29

## 2017-07-28 MED ORDER — IOPAMIDOL (ISOVUE-300) INJECTION 61%
100.0000 mL | Freq: Once | INTRAVENOUS | Status: AC | PRN
  Administered 2017-07-28: 100 mL via INTRAVENOUS

## 2017-07-28 MED ORDER — PROPOFOL 10 MG/ML IV BOLUS
INTRAVENOUS | Status: AC
  Filled 2017-07-28: qty 20

## 2017-07-28 MED ORDER — MIDAZOLAM HCL 2 MG/2ML IJ SOLN
INTRAMUSCULAR | Status: DC | PRN
  Administered 2017-07-28: 2 mg via INTRAVENOUS

## 2017-07-28 MED ORDER — SUCCINYLCHOLINE CHLORIDE 20 MG/ML IJ SOLN
INTRAMUSCULAR | Status: DC | PRN
  Administered 2017-07-28: 100 mg via INTRAVENOUS

## 2017-07-28 MED ORDER — DEXAMETHASONE SODIUM PHOSPHATE 10 MG/ML IJ SOLN
INTRAMUSCULAR | Status: DC | PRN
  Administered 2017-07-28: 8 mg via INTRAVENOUS

## 2017-07-28 MED ORDER — FENTANYL CITRATE (PF) 100 MCG/2ML IJ SOLN
50.0000 ug | Freq: Once | INTRAMUSCULAR | Status: AC
  Administered 2017-07-28: 50 ug via INTRAVENOUS
  Filled 2017-07-28: qty 2

## 2017-07-28 MED ORDER — IOPAMIDOL (ISOVUE-300) INJECTION 61%
30.0000 mL | Freq: Once | INTRAVENOUS | Status: AC | PRN
  Administered 2017-07-28: 30 mL via ORAL

## 2017-07-28 MED ORDER — KCL IN DEXTROSE-NACL 20-5-0.2 MEQ/L-%-% IV SOLN
INTRAVENOUS | Status: DC
  Administered 2017-07-28 – 2017-07-29 (×2): via INTRAVENOUS
  Filled 2017-07-28 (×3): qty 1000

## 2017-07-28 MED ORDER — HEPARIN SODIUM (PORCINE) 5000 UNIT/ML IJ SOLN
INTRAMUSCULAR | Status: AC
  Filled 2017-07-28: qty 1

## 2017-07-28 MED ORDER — PROPOFOL 10 MG/ML IV BOLUS
INTRAVENOUS | Status: DC | PRN
  Administered 2017-07-28: 160 mg via INTRAVENOUS

## 2017-07-28 MED ORDER — FENTANYL CITRATE (PF) 100 MCG/2ML IJ SOLN
INTRAMUSCULAR | Status: AC
  Filled 2017-07-28: qty 2

## 2017-07-28 MED ORDER — HYDROCODONE-ACETAMINOPHEN 5-325 MG PO TABS: 1.0000 | ORAL_TABLET | ORAL | Status: DC | PRN

## 2017-07-28 MED ORDER — ONDANSETRON HCL 4 MG/2ML IJ SOLN
4.0000 mg | Freq: Once | INTRAMUSCULAR | Status: AC
  Administered 2017-07-28: 4 mg via INTRAVENOUS
  Filled 2017-07-28: qty 2

## 2017-07-28 MED ORDER — ONDANSETRON HCL 4 MG/2ML IJ SOLN
INTRAMUSCULAR | Status: DC | PRN
  Administered 2017-07-28: 4 mg via INTRAVENOUS

## 2017-07-28 MED ORDER — DEXTROSE 5 % IV SOLN
2.0000 g | Freq: Once | INTRAVENOUS | Status: AC
  Administered 2017-07-28: 2 g via INTRAVENOUS
  Filled 2017-07-28: qty 2

## 2017-07-28 MED ORDER — MIDAZOLAM HCL 2 MG/2ML IJ SOLN
INTRAMUSCULAR | Status: AC
  Filled 2017-07-28: qty 2

## 2017-07-28 MED ORDER — LACTATED RINGERS IV SOLN
INTRAVENOUS | Status: DC | PRN
  Administered 2017-07-28: 12:00:00 via INTRAVENOUS

## 2017-07-28 MED ORDER — SUGAMMADEX SODIUM 200 MG/2ML IV SOLN
INTRAVENOUS | Status: DC | PRN
  Administered 2017-07-28: 150 mg via INTRAVENOUS

## 2017-07-28 MED ORDER — ONDANSETRON 4 MG PO TBDP
4.0000 mg | ORAL_TABLET | Freq: Four times a day (QID) | ORAL | Status: DC | PRN
  Filled 2017-07-28: qty 1

## 2017-07-28 MED ORDER — ONDANSETRON HCL 4 MG/2ML IJ SOLN: 4.0000 mg | Freq: Four times a day (QID) | INTRAMUSCULAR | Status: DC | PRN

## 2017-07-28 MED ORDER — KETOROLAC TROMETHAMINE 30 MG/ML IJ SOLN
INTRAMUSCULAR | Status: DC | PRN
  Administered 2017-07-28: 30 mg via INTRAVENOUS

## 2017-07-28 MED ORDER — ACETAMINOPHEN 325 MG PO TABS
650.0000 mg | ORAL_TABLET | Freq: Four times a day (QID) | ORAL | Status: DC | PRN
  Administered 2017-07-28: 650 mg via ORAL
  Filled 2017-07-28: qty 2

## 2017-07-28 MED ORDER — SODIUM CHLORIDE 0.9 % IV BOLUS (SEPSIS)
1000.0000 mL | Freq: Once | INTRAVENOUS | Status: AC
  Administered 2017-07-28: 1000 mL via INTRAVENOUS

## 2017-07-28 MED ORDER — LACTATED RINGERS IV SOLN: INTRAVENOUS | Status: DC

## 2017-07-28 MED ORDER — ONDANSETRON HCL 4 MG/2ML IJ SOLN: 4.0000 mg | Freq: Once | INTRAMUSCULAR | Status: DC | PRN

## 2017-07-28 MED ORDER — FENTANYL CITRATE (PF) 100 MCG/2ML IJ SOLN
INTRAMUSCULAR | Status: DC | PRN
  Administered 2017-07-28: 100 ug via INTRAVENOUS

## 2017-07-28 MED ORDER — ACETAMINOPHEN 650 MG RE SUPP: 650.0000 mg | Freq: Four times a day (QID) | RECTAL | Status: DC | PRN

## 2017-07-28 MED ORDER — ROCURONIUM BROMIDE 100 MG/10ML IV SOLN
INTRAVENOUS | Status: DC | PRN
  Administered 2017-07-28: 40 mg via INTRAVENOUS

## 2017-07-28 SURGICAL SUPPLY — 37 items
CANISTER SUCT 1200ML W/VALVE (MISCELLANEOUS) ×3 IMPLANT
CANNULA DILATOR 10 W/SLV (CANNULA) ×2 IMPLANT
CANNULA DILATOR 10MM W/SLV (CANNULA) ×1
CHLORAPREP W/TINT 26ML (MISCELLANEOUS) ×3 IMPLANT
CLOSURE WOUND 1/2 X4 (GAUZE/BANDAGES/DRESSINGS)
CUTTER FLEX LINEAR 45M (STAPLE) ×3 IMPLANT
ELECT REM PT RETURN 9FT ADLT (ELECTROSURGICAL) ×3
ELECTRODE REM PT RTRN 9FT ADLT (ELECTROSURGICAL) ×1 IMPLANT
GAUZE SPONGE 4X4 12PLY STRL (GAUZE/BANDAGES/DRESSINGS) ×3 IMPLANT
GLOVE BIO SURGEON STRL SZ7.5 (GLOVE) ×7 IMPLANT
GOWN STRL REUS W/ TWL LRG LVL3 (GOWN DISPOSABLE) ×2 IMPLANT
GOWN STRL REUS W/TWL LRG LVL3 (GOWN DISPOSABLE) ×6
IRRIGATION STRYKERFLOW (MISCELLANEOUS) ×1 IMPLANT
IRRIGATOR STRYKERFLOW (MISCELLANEOUS) ×3
IV NS 1000ML (IV SOLUTION) ×3
IV NS 1000ML BAXH (IV SOLUTION) ×1 IMPLANT
KIT RM TURNOVER STRD PROC AR (KITS) ×3 IMPLANT
LABEL OR SOLS (LABEL) ×1 IMPLANT
LOOP SUT CHROMIC 0 SGL3 (SUTURE) ×1 IMPLANT
NDL FILTER BLUNT 18X1 1/2 (NEEDLE) ×1 IMPLANT
NDL INSUFF ACCESS 14 VERSASTEP (NEEDLE) ×3 IMPLANT
NEEDLE FILTER BLUNT 18X 1/2SAF (NEEDLE)
NEEDLE FILTER BLUNT 18X1 1/2 (NEEDLE) IMPLANT
NS IRRIG 500ML POUR BTL (IV SOLUTION) ×3 IMPLANT
PACK LAP CHOLECYSTECTOMY (MISCELLANEOUS) ×3 IMPLANT
POUCH ENDO CATCH 10MM SPEC (MISCELLANEOUS) ×3 IMPLANT
RELOAD 45 VASCULAR/THIN (ENDOMECHANICALS) ×6 IMPLANT
RELOAD STAPLE 45 2.5 WHT GRN (ENDOMECHANICALS) ×1 IMPLANT
SCISSORS METZENBAUM CVD 33 (INSTRUMENTS) ×3 IMPLANT
SEAL FOR SCOPE WARMER C3101 (MISCELLANEOUS) ×1 IMPLANT
STRIP CLOSURE SKIN 1/2X4 (GAUZE/BANDAGES/DRESSINGS) ×1 IMPLANT
SUT CHROMIC 5 0 RB 1 27 (SUTURE) ×3 IMPLANT
TRAY FOLEY W/METER SILVER 16FR (SET/KITS/TRAYS/PACK) ×1 IMPLANT
TROCAR XCEL 12X100 BLDLESS (ENDOMECHANICALS) ×3 IMPLANT
TROCAR XCEL NON-BLD 5MMX100MML (ENDOMECHANICALS) ×3 IMPLANT
TUBING INSUFFLATOR HI FLOW (MISCELLANEOUS) ×3 IMPLANT
WATER STERILE IRR 1000ML POUR (IV SOLUTION) ×3 IMPLANT

## 2017-07-28 NOTE — Discharge Instructions (Addendum)
Remove dressings Sunday.  May shower Monday and blot dry.  Steri-Strips will likely stick for about a week and eventually peel off.  Take Tylenol if needed for pain.  Avoid straining and heavy lifting for the first week after surgery.

## 2017-07-28 NOTE — Transfer of Care (Signed)
Immediate Anesthesia Transfer of Care Note  Patient: Henry [name]  Procedure(s) Performed: APPENDECTOMY LAPAROSCOPIC (N/A )  Patient Location: PACU  Anesthesia Type:General  Level of Consciousness: awake and alert   Airway & Oxygen Therapy: Patient Spontanous Breathing and Patient connected to face mask oxygen  Post-op Assessment: Report given to RN  Post vital signs: Reviewed and stable  Last Vitals:  Vitals:   07/28/17 0730 07/28/17 0930  BP: 131/83 119/71  Pulse: 68 64  Resp:    Temp:    SpO2: 100% 95%    Last Pain:  Vitals:   07/28/17 0959  TempSrc:   PainSc: 1          Complications: No apparent anesthesia complications

## 2017-07-28 NOTE — Op Note (Signed)
OPERATIVE REPORT  PREOPERATIVE  DIAGNOSIS: . Acute appendicitis  POSTOPERATIVE DIAGNOSIS: . Acute appendicitis  PROCEDURE: . Laparoscopic appendectomy  ANESTHESIA:  General  SURGEON: Renda Rolls  MD   INDICATIONS: . He was admitted emergently with right lower quadrant pain and tenderness and CT findings of appendicitis  With the patient on the operating table in the supine position under general anesthesia the abdomen was clipped and prepared with ChloraPrep and draped in a sterile manner. An incision was made in the inferior portion of the umbilicus and carried down to the deep fascia which was grasped with a laryngeal hook. A Veress needle was inserted aspirated and irrigated with a saline solution. The peritoneal cavity was insufflated with carbon dioxide. The Veress needle was removed. The 10 mm cannula was advanced down through the sheath. The peritoneal cavity was inflated with carbon dioxide.  Initial inspection revealed typical appearance of small and large bowel. Another incision was made in the left lower quadrant in the suprapubic area to introduce a 12 mm cannula. Another incision was made in the right lower quadrant suprapubic area to introduce a 5 mm cannula. The patient was placed in Trendelenburg position and tilted several degrees to the left. The cecum was identified and retracted medially to expose an acutely inflamed appendix which was partially retrocecal. The appendix was mobilized with incision of the lateral peritoneal reflection. Several small bleeding points were cauterized. A window was created in the mesoappendix adjacent to the base of the appendix. The blue cartridge Endo GIA stapler was placed across the appendix adjacent to the cecum engaged and activated and removed. This separated the appendix from the cecum. Staple lines were intact. Next the mesoappendix was dissected and divided with 2 loads of the white cartridge Endo GIA stapler. The appendix was placed into  an Endo Catch bag and brought out through the left lower quadrant port site and submitted in formalin for routine pathology. The port was reintroduced so that the operative area could be further examined. There was some blood identified at the operative site which was aspirated. The site was irrigated with heparinized saline solution and aspirated several times. Hemostasis subsequently was intact. Estimated blood loss approximately 5 cc. The cannulas were removed seeing no bleeding from the cannula sites. Carbon dioxide escaped from the peritoneal cavity. There was a visible fascial defect at the umbilical incision and this was closed with 3-0 PDS figure-of-eight suture. The left lower quadrant incision was examined and the external oblique aponeurosis was approximated with a 3-0 PDS figure-of-eight suture. Skin incisions were closed with 5-0 chromic subcuticular suture benzoin and Steri-Strips. Dressings were applied using folded cotton gauze and 2 inch paper tape  The patient tolerated surgery satisfactorily and was then prepared for transfer to the recovery room  [employer].D.

## 2017-07-28 NOTE — ED Provider Notes (Signed)
[facility] Emergency Department Provider Note   ____________________________________________   First MD Initiated Contact with Patient 07/28/17 [phone]     (approximate)  I have reviewed the triage vital signs and the nursing notes.   HISTORY  Chief Complaint Abdominal Pain    HPI LYDIA MENG [name] is a 41 y.o. male who presents to the ED from home with a chief complaint of abdominal pain.Reports a 3 day history of waxing/waning right sided abdominal pain associated with nausea and anorexia.denies associated fever, chills, chest pain, shortness of breath, vomiting, dysuria, diarrhea, testicular pain or swelling. Denies recent travel, trauma, camping or antibiotic use. Nothing makes his symptoms better or worse.   History reviewed. No pertinent past medical history.  Patient Active Problem List   Diagnosis Date Noted  . Cough 03/29/2017  . 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 Routine general medical examination at a health care facility 05/18/2013  . HEMORRHOIDS, EXTERNAL 05/13/2007    Past Surgical History:  Procedure Laterality Date  . BUNIONECTOMY    . HERNIA REPAIR    . TONSILLECTOMY      Prior to Admission medications   Medication Sig Start Date End Date Taking? Authorizing Provider  fluticasone (FLONASE) 50 MCG/ACT nasal spray Place 2 sprays into both nostrils daily. 03/29/17   Eustaquio Boyden, MD  guaiFENesin-codeine [facility]) 100-10 MG/5ML syrup Take 5 mLs by mouth 2 (two) times daily as needed. 03/29/17   Eustaquio Boyden, MD  ibuprofen (ADVIL,MOTRIN) 800 MG tablet Take 800 mg by mouth 3 (three) times daily. 04/13/15   [provider]    Allergies Patient has no known allergies.  History reviewed. No pertinent family history.  Social History Social History  Substance Use Topics  . Smoking status: Never Smoker  . Smokeless tobacco: Never Used  . Alcohol use 0.0 oz/week     Comment: weekly    Review of  Systems  Constitutional: No fever/chills. Eyes: No visual changes. ENT: No sore throat. Cardiovascular: Denies chest pain. Respiratory: Denies shortness of breath. Gastrointestinal: positive for abdominal pain and nausea, no vomiting.  No diarrhea.  No constipation. Genitourinary: Negative for dysuria. Musculoskeletal: Negative for back pain. Skin: Negative for rash. Neurological: Negative for headaches, focal weakness or numbness.   ____________________________________________   PHYSICAL EXAM:  VITAL SIGNS: ED Triage Vitals  Enc Vitals Group     BP 07/27/17 2148 (!) 157/101     Pulse Rate 07/27/17 2148 (!) 56     Resp 07/27/17 2148 18     Temp 07/27/17 2148 97.7 F (36.5 C)     Temp Source 07/27/17 2148 Oral     SpO2 07/27/17 2148 100 %     Weight 07/27/17 2148 190 lb (86.2 kg)     Height 07/27/17 2148  (1.778 m)     Head Circumference --      Peak Flow --      Pain Score 07/27/17 2147 3     Pain Loc --      Pain Edu? --      Excl. in GC? --     Constitutional: Alert and oriented. Well appearing and in mild acute distress. Eyes: Conjunctivae are normal. PERRL. EOMI. Head: Atraumatic. Nose: No congestion/rhinnorhea. Mouth/Throat: Mucous membranes are moist.  Oropharynx non-erythematous. Neck: No stridor.   Cardiovascular: Normal rate, regular rhythm. Grossly normal heart sounds.  Good peripheral circulation. Respiratory: Normal respiratory effort.  No retractions. Lungs CTAB. Gastrointestinal: Soft and moderately tender to palpation right lower,  mid and upper quadrants. Pain at McBurney's point with voluntary guarding, no rebound. No distention. No abdominal bruits. No CVA tenderness. Musculoskeletal: No lower extremity tenderness nor edema.  No joint effusions. Neurologic:  Normal speech and language. No gross focal neurologic deficits are appreciated. No gait instability. Skin:  Skin is warm, dry and intact. No rash noted. Psychiatric: Mood and affect are  normal. Speech and behavior are normal.  ____________________________________________   LABS (all labs ordered are listed, but only abnormal results are displayed)  Labs Reviewed  COMPREHENSIVE METABOLIC PANEL - Abnormal; Notable for the following:       Result Value   Glucose, Bld 111 (*)    All other components within normal limits  CBC - Abnormal; Notable for the following:    WBC 11.0 (*)    All other components within normal limits  URINALYSIS, COMPLETE (UACMP) WITH MICROSCOPIC - Abnormal; Notable for the following:    Color, Urine YELLOW (*)    APPearance CLEAR (*)    Hgb urine dipstick SMALL (*)    Ketones, ur 5 (*)    Bacteria, UA RARE (*)    Squamous Epithelial / LPF 0-5 (*)    All other components within normal limits  LIPASE, BLOOD   ____________________________________________  EKG  None ____________________________________________  RADIOLOGY  Ct Abdomen Pelvis W Contrast  Result Date: 07/28/2017 CLINICAL DATA:  Acute onset of right lower quadrant abdominal pain and nausea. Initial encounter. EXAM: CT ABDOMEN AND PELVIS WITH CONTRAST TECHNIQUE: Multidetector CT imaging of the abdomen and pelvis was performed using the standard protocol following bolus administration of intravenous contrast. CONTRAST:  ISOVUE-300 IOPAMIDOL (ISOVUE-300) INJECTION 61% COMPARISON:  None. FINDINGS: Lower chest: The visualized lung bases are grossly clear. The visualized portions of the mediastinum are unremarkable. Hepatobiliary: The liver is unremarkable in appearance. The gallbladder is unremarkable in appearance. The common bile duct remains normal in caliber. Pancreas: The pancreas is within normal limits. Spleen: The spleen is unremarkable in appearance. Adrenals/Urinary Tract: The adrenal glands are unremarkable in appearance. A small right renal cyst is noted. There is no evidence of hydronephrosis. No renal or ureteral stones are identified. No perinephric stranding is seen.  Stomach/Bowel: The appendix is dilated to 1.2 cm in maximal diameter, with diffuse wall thickening and surrounding soft tissue inflammation. Trace associated free fluid is noted. There is no evidence of perforation or abscess formation at this time. The appendix is retrocecal in nature. No appendicolith is seen. The colon is unremarkable in appearance. The small bowel is grossly unremarkable. The stomach is within normal limits. Vascular/Lymphatic: The abdominal aorta is unremarkable in appearance. The inferior vena cava is grossly unremarkable. No retroperitoneal lymphadenopathy is seen. No pelvic sidewall lymphadenopathy is identified. Reproductive: The bladder is mildly distended and grossly unremarkable. The prostate is mildly enlarged, measuring 5.0 cm in transverse dimension. Other: A small left inguinal hernia is noted, containing only fat. Musculoskeletal: No acute osseous abnormalities are identified. The visualized musculature is unremarkable in appearance. IMPRESSION: 1. Acute appendicitis, with dilatation of the appendix to 1.2 cm in maximal diameter, diffuse wall thickening and surrounding soft tissue inflammation. No evidence of perforation or abscess formation at this time. The appendix is retrocecal in nature. 2. Mildly enlarged prostate. 3. Small left inguinal hernia, containing only fat. 4. Small right renal cyst noted. These results were called by telephone at the time of interpretation on 07/28/2017 at 6:57 am to Dr. Chiquita Loth, who verbally acknowledged these results. Electronically Signed   By:  Roanna Raider M.D.   On: 07/28/2017 06:58    ____________________________________________   PROCEDURES  Procedure(s) performed: None  Procedures  Critical Care performed: No  ____________________________________________   INITIAL IMPRESSION / ASSESSMENT AND PLAN / ED COURSE  As part of my medical decision making, I reviewed the following data within the electronic MEDICAL RECORD NUMBER  Nursing notes reviewed and incorporated, Labs reviewed and Notes from prior ED visits.   41 year old male who presents with right-sided abdominal pain associated with nausea. Differential diagnosis includes, but is not limited to, acute appendicitis, renal colic, testicular torsion, urinary tract infection/pyelonephritis, prostatitis,  epididymitis, diverticulitis, small bowel obstruction or ileus, colitis, abdominal aortic aneurysm, gastroenteritis, hernia, etc. Laboratory urinalysis results fairly unremarkable. Will administer IV analgesia/antiemetic and proceed to CT abdomen/pelvis to evaluate etiology of patient's pain.  Clinical Course as of Jul 28 720  Sat Jul 28, 2017  0700 Discussed CT results of acute, nonperforated appendicitis with Dr. Cherly Hensen. Will discuss with general surgery on-call.  [JS]  [id number] Spoke with Dr. Malissa Hippo who will evaluate patient in the emergency department. Patient and spouse updated and agreeable to plan of care.  [JS]    Clinical Course User Index [JS] Irean Hong, MD     ____________________________________________   FINAL CLINICAL IMPRESSION(S) / ED DIAGNOSES  Final diagnoses:  Right lower quadrant abdominal pain  Acute appendicitis, unspecified acute appendicitis type      NEW MEDICATIONS STARTED DURING THIS VISIT:  New Prescriptions   No medications on file     Note:  This document was prepared using Dragon voice recognition software and may include unintentional dictation errors.    Irean Hong, MD 07/28/17 [phone]

## 2017-07-28 NOTE — Progress Notes (Signed)
He reports good progress after surgery.  He does have a mild headache.  He is hungry.  He reports minimal abdominal discomfort.  He is voiding satisfactorily  On exam his dressings are dry.  I discussed the findings of appendicitis his surgery and postoperative plan.  We will start clear liquid diet and advance as tolerated to full liquid diet.  Order Tylenol for headache.  Possible discharge tomorrow.  Anticipate removing dressings Sunday and shower Monday..  Will likely need to remain out of work for about 1 week.

## 2017-07-28 NOTE — Anesthesia Preprocedure Evaluation (Signed)
Anesthesia Evaluation  Patient identified by MRN, date of birth, ID band Patient awake    Reviewed: Allergy & Precautions, NPO status , Patient's Chart, lab work & pertinent test results  History of Anesthesia Complications Negative for: history of anesthetic complications  Airway Mallampati: II       Dental   Pulmonary neg sleep apnea, neg COPD,           Cardiovascular (-) hypertension(-) Past MI and (-) CHF (-) dysrhythmias (-) Valvular Problems/Murmurs     Neuro/Psych neg Seizures    GI/Hepatic Neg liver ROS, neg GERD  ,  Endo/Other  neg diabetes  Renal/GU negative Renal ROS     Musculoskeletal   Abdominal   Peds  Hematology   Anesthesia Other Findings   Reproductive/Obstetrics                             Anesthesia Physical Anesthesia Plan  ASA: I and emergent  Anesthesia Plan: General   Post-op Pain Management:    Induction: Intravenous  PONV Risk Score and Plan: 2 and Ondansetron and Dexamethasone  Airway Management Planned: Oral ETT  Additional Equipment:   Intra-op Plan:   Post-operative Plan:   Informed Consent: I have reviewed the patients History and Physical, chart, labs and discussed the procedure including the risks, benefits and alternatives for the proposed anesthesia with the patient or authorized representative who has indicated his/her understanding and acceptance.     Plan Discussed with:   Anesthesia Plan Comments:         Anesthesia Quick Evaluation

## 2017-07-28 NOTE — Plan of Care (Signed)
Problem: Pain Managment: Goal: General experience of comfort will improve Outcome: Progressing Pt admitted from PACU. S/P lapp appy. Abd dressings dry and intact. Pt a&o. VSS. Tylenol given once for headache with improvement. Pt tolerated clear liquid diet. Voided. Pt uses incentive spirometer.

## 2017-07-28 NOTE — Anesthesia Postprocedure Evaluation (Signed)
Anesthesia Post Note  Patient: Henry [name]  Procedure(s) Performed: APPENDECTOMY LAPAROSCOPIC (N/A )  Patient location during evaluation: PACU Anesthesia Type: General Level of consciousness: awake and alert Pain management: pain level controlled Vital Signs Assessment: post-procedure vital signs reviewed and stable Respiratory status: spontaneous breathing and respiratory function stable Cardiovascular status: stable Anesthetic complications: no     Last Vitals:  Vitals:   07/28/17 1455 07/28/17 1526  BP: 113/72 125/69  Pulse: 64 71  Resp: 13 18  Temp: 36.4 C 36.6 C  SpO2: 93% 98%    Last Pain:  Vitals:   07/28/17 1723  TempSrc:   PainSc: 4                  Debar Plate K

## 2017-07-28 NOTE — ED Notes (Signed)
Consent signed by patient. Family at bedside.

## 2017-07-28 NOTE — Anesthesia Post-op Follow-up Note (Signed)
Anesthesia QCDR form completed.        

## 2017-07-28 NOTE — Anesthesia Procedure Notes (Signed)
Procedure Name: Intubation Date/Time: 07/28/2017 11:49 AM Performed by: OFPULGS, Davit Vassar Pre-anesthesia Checklist: Emergency Drugs available, Patient being monitored, Timeout performed, Suction available and Patient identified Patient Re-evaluated:Patient Re-evaluated prior to induction Oxygen Delivery Method: Circle system utilized Preoxygenation: Pre-oxygenation with 100% oxygen Induction Type: IV induction, Rapid sequence and Cricoid Pressure applied Ventilation: Mask ventilation without difficulty Laryngoscope Size: Mac and 4 Grade View: Grade II Tube type: Oral Tube size: 7.5 mm Number of attempts: 1 Airway Equipment and Method: Stylet Placement Confirmation: ETT inserted through vocal cords under direct vision,  positive ETCO2,  CO2 detector and breath sounds checked- equal and bilateral Secured at: 22 cm Tube secured with: Tape

## 2017-07-28 NOTE — H&P (Signed)
Henry [name] is an 41 y.o. male.   Chief Complaint: abdominal pain HPI: he reports he was well and in his usual state of health until 3 days ago when he developed some vague generalized periumbilical discomfort.He continued with his usual activities at that time. He was able to work all day yesterday.After arriving home last night he felt increased pain in the right lower quadrant of the abdomen. The pain was aggravated by movement. He also had minimal degree of chills. Also minimal nausea. No vomiting. He reports he has been moving his bowels satisfactorily and last bowel movement was last evening. He reports he has been voiding satisfactorily. His last meal was last night. He did consume oral contrast for his CT scan while in the emergency room.  History reviewed. No pertinent past medical history.  Past Surgical History:  Procedure Laterality Date  . BUNIONECTOMY    . HERNIA REPAIR    . TONSILLECTOMY    . He had right inguinal hernia repair when he was 41 years old.  FAMILY HISTORY:. He reports both of his parents had heart disease. Also his father had lung cancer.  PAST MEDICAL HISTORY: He reports no chronic major medical problems.he reports no history of heart disease or lung disease or hepatitis.  Social History:  reports that he has never smoked. He has never used smokeless tobacco. He reports that he drinks alcohol. He reports that he does not use drugs. . He drinks some beer about once a week.Marland Kitchen He works at [employer] and does not do much heavy lifting or straining.  Allergies: No Known Allergies  MEDICINES: He occasionally takes Tums for heartburn. No other medicines.  REVIEW of SYSTEMS: He reports no other recent acute illness such as cough cold or sore throat. He reports no recent visual changes. He reports no swollen glands in his neck and no difficulty swallowing. He does occasionally have heartburn for which he takes Tums. He reports no dyspnea on exertion.No chest pains. He  reports recently has been moving his bowels satisfactorily and voiding satisfactorily. He reports no recent source or boils. He occasionally has some mild bilateral ankle edema after standing most of a day. Review of systems otherwise negative.  Results for orders placed or performed during the hospital encounter of 07/28/17 (from the past 48 hour(s))  Lipase, blood     Status: None   Collection Time: 07/27/17  9:45 PM  Result Value Ref Range   Lipase 25 11 - 51 U/L  Comprehensive metabolic panel     Status: Abnormal   Collection Time: 07/27/17  9:45 PM  Result Value Ref Range   Sodium 138 135 - 145 mmol/L   Potassium 4.1 3.5 - 5.1 mmol/L   Chloride 103 101 - 111 mmol/L   CO2 25 22 - 32 mmol/L   Glucose, Bld 111 (H) 65 - 99 mg/dL   BUN 13 6 - 20 mg/dL   Creatinine, Ser 1.06 0.61 - 1.24 mg/dL   Calcium 9.4 8.9 - 10.3 mg/dL   Total Protein 8.0 6.5 - 8.1 g/dL   Albumin 4.7 3.5 - 5.0 g/dL   AST 27 15 - 41 U/L   ALT 43 17 - 63 U/L   Alkaline Phosphatase 92 38 - 126 U/L   Total Bilirubin 0.9 0.3 - 1.2 mg/dL   GFR calc non Af Amer >60 >60 mL/min   GFR calc Af Amer >60 >60 mL/min    Comment: (NOTE) The eGFR has been calculated using the  CKD EPI equation. This calculation has not been validated in all clinical situations. eGFR's persistently <60 mL/min signify possible Chronic Kidney Disease.    Anion gap 10 5 - 15  CBC     Status: Abnormal   Collection Time: 07/27/17  9:45 PM  Result Value Ref Range   WBC 11.0 (H) 3.8 - 10.6 K/uL   RBC 5.14 4.40 - 5.90 MIL/uL   Hemoglobin 15.5 13.0 - 18.0 g/dL   HCT 45.8 40.0 - 52.0 %   MCV 89.0 80.0 - 100.0 fL   MCH 30.2 26.0 - 34.0 pg   MCHC 33.9 32.0 - 36.0 g/dL   RDW 13.1 11.5 - 14.5 %   Platelets 252 150 - 440 K/uL  Urinalysis, Complete w Microscopic     Status: Abnormal   Collection Time: 07/28/17  3:25 AM  Result Value Ref Range   Color, Urine YELLOW (A) YELLOW   APPearance CLEAR (A) CLEAR   Specific Gravity, Urine 1.017 1.005 - 1.030    pH 5.0 5.0 - 8.0   Glucose, UA NEGATIVE NEGATIVE mg/dL   Hgb urine dipstick SMALL (A) NEGATIVE   Bilirubin Urine NEGATIVE NEGATIVE   Ketones, ur 5 (A) NEGATIVE mg/dL   Protein, ur NEGATIVE NEGATIVE mg/dL   Nitrite NEGATIVE NEGATIVE   Leukocytes, UA NEGATIVE NEGATIVE   RBC / HPF 0-5 0 - 5 RBC/hpf   WBC, UA 0-5 0 - 5 WBC/hpf   Bacteria, UA RARE (A) NONE SEEN   Squamous Epithelial / LPF 0-5 (A) NONE SEEN   Mucus PRESENT    Ct Abdomen Pelvis W Contrast  Result Date: 07/28/2017 CLINICAL DATA:  Acute onset of right lower quadrant abdominal pain and nausea. Initial encounter. EXAM: CT ABDOMEN AND PELVIS WITH CONTRAST TECHNIQUE: Multidetector CT imaging of the abdomen and pelvis was performed using the standard protocol following bolus administration of intravenous contrast. CONTRAST:  166m ISOVUE-300 IOPAMIDOL (ISOVUE-300) INJECTION 61% COMPARISON:  None. FINDINGS: Lower chest: The visualized lung bases are grossly clear. The visualized portions of the mediastinum are unremarkable. Hepatobiliary: The liver is unremarkable in appearance. The gallbladder is unremarkable in appearance. The common bile duct remains normal in caliber. Pancreas: The pancreas is within normal limits. Spleen: The spleen is unremarkable in appearance. Adrenals/Urinary Tract: The adrenal glands are unremarkable in appearance. A small right renal cyst is noted. There is no evidence of hydronephrosis. No renal or ureteral stones are identified. No perinephric stranding is seen. Stomach/Bowel: The appendix is dilated to 1.2 cm in maximal diameter, with diffuse wall thickening and surrounding soft tissue inflammation. Trace associated free fluid is noted. There is no evidence of perforation or abscess formation at this time. The appendix is retrocecal in nature. No appendicolith is seen. The colon is unremarkable in appearance. The small bowel is grossly unremarkable. The stomach is within normal limits. Vascular/Lymphatic: The  abdominal aorta is unremarkable in appearance. The inferior vena cava is grossly unremarkable. No retroperitoneal lymphadenopathy is seen. No pelvic sidewall lymphadenopathy is identified. Reproductive: The bladder is mildly distended and grossly unremarkable. The prostate is mildly enlarged, measuring 5.0 cm in transverse dimension. Other: A small left inguinal hernia is noted, containing only fat. Musculoskeletal: No acute osseous abnormalities are identified. The visualized musculature is unremarkable in appearance. IMPRESSION: 1. Acute appendicitis, with dilatation of the appendix to 1.2 cm in maximal diameter, diffuse wall thickening and surrounding soft tissue inflammation. No evidence of perforation or abscess formation at this time. The appendix is retrocecal in nature. 2. Mildly enlarged  prostate. 3. Small left inguinal hernia, containing only fat. 4. Small right renal cyst noted. These results were called by telephone at the time of interpretation on 07/28/2017 at 6:57 am to Dr. Lurline Hare, who verbally acknowledged these results. Electronically Signed   By: Garald Balding M.D.   On: 07/28/2017 06:58    Blood pressure 121/81, pulse 64, temperature 97.7 F (36.5 C), temperature source Oral, resp. rate 18, height _0  (1.778 m), weight 190 lb (86.2 kg), SpO2 96 %.  Physical Exam: GENERAL: He is awake alert oriented resting on the emergency room stretcher in minimal degree of distress.  HEENT:  Head is normocephalic.  Pupils are equal reactive to light.  Extraocular movements are intact. Sclera is clear.  Pharynx is clear.  NECK:  Supple with no palpable mass and no adenopathy.  HEART:  Regular rhythm S1-S2, without murmur.  LUNGS:  Clear without rales rhonchi or wheezes.  ABDOMEN: Soft and flat with mild degree of right lower quadrant tenderness at McBurney's point with minimal degree of guarding on deeper palpation. No palpable mass.  EXTREMITIES:  Well-developed well-nourished with no  dependent edema.  NEUROLOGIC:  Awake alert and moving all extremities.  I reviewed his CT images seeing a dilated appendix. This is consistent with acute appendicitis.  Assessment/Plan Acute appendicitis  I recommended laparoscopic appendectomy. I discussed the operation care risk and benefits with him in detail. Also discussed possibility of having to convert to an open procedure.I have discussed this with the emergency room physician and also with the OR staff and have scheduled him for surgery today.  Plan preoperative dose of Cefotan. Administer oral Pepcid. Anticipate overnight observation as well.  Rochel Brome, MD 07/28/2017, 8:35 AM

## 2017-07-29 ENCOUNTER — Encounter: Payer: Self-pay | Admitting: Surgery

## 2017-07-29 MED ORDER — HYDROCODONE-ACETAMINOPHEN 5-325 MG PO TABS: 1.0000 | ORAL_TABLET | ORAL | 0 refills | Status: DC | PRN

## 2017-07-29 NOTE — Progress Notes (Signed)
Pt has been discharged home. Discharge papers given and explained to pt. Pt verbalized understanding. Meds and f/u appointment reviewed. RX given. 

## 2017-07-29 NOTE — Discharge Summary (Signed)
Physician Discharge Summary  Patient ID: Henry [name] MRN: [id number] DOB/AGE: October 09, 1976 41 y.o.  Admit date: 07/28/2017 Discharge date: 07/29/2017  Admission Diagnoses:  Discharge Diagnoses:  Active Problems:   Acute appendicitis   Discharged Condition: good  Hospital Course: 41 y.o. male presented to [facility] ED for abdominal pain. Workup was found to be significant for CT imaging demonstrating acute appendicitis. Informed consent was obtained and documented, and patient underwent uneventful laparoscopic appendectomy [name], 07/28/2017).  Post-operatively, patient's pain improved/resolved and advancement of patient's diet and ambulation were well-tolerated. The remainder of patient's hospital course was essentially unremarkable, and discharge planning was initiated accordingly with patient safely able to be discharged home with appropriate discharge instructions, pain control, and outpatient surgical follow-up after all of his questions were answered to his expressed satisfaction.  Consults: None  Significant Diagnostic Studies: radiology: CT scan: acute appendicitis  Treatments: IV hydration, antibiotics: cefotetan and surgery: laparoscopic appendectomy  Discharge Exam: Blood pressure (!) 105/49, pulse 68, temperature 98.3 F (36.8 C), temperature source Oral, resp. rate 16, height  (1.778 m), weight 193 lb (87.5 kg), SpO2 97 %. General appearance: alert, cooperative and no distress GI: abdomen soft and non-distended with appropriate mild peri-incisional tenderness to palpation, dressings c/d/i without surrounding erythema  Disposition: Final discharge disposition not confirmed   Allergies as of 07/29/2017   No Known Allergies     Medication List    TAKE these medications   fluticasone 50 MCG/ACT nasal spray Commonly known as:  FLONASE Place 2 sprays into both nostrils daily.   HYDROcodone-acetaminophen 5-325 MG tablet Commonly known as:  NORCO/VICODIN Take  1-2 tablets by mouth every 4 (four) hours as needed for moderate pain.        Signed: Ancil Linsey 07/29/2017, 6:41 AM

## 2017-07-30 LAB — HIV ANTIBODY (ROUTINE TESTING W REFLEX): HIV Screen 4th Generation wRfx: NONREACTIVE

## 2017-07-31 LAB — SURGICAL PATHOLOGY

## 2017-08-01 ENCOUNTER — Other Ambulatory Visit: Payer: Self-pay | Admitting: Surgery

## 2017-08-02 ENCOUNTER — Telehealth: Payer: Self-pay | Admitting: General Practice

## 2017-08-02 DIAGNOSIS — Z09 Encounter for follow-up examination after completed treatment for conditions other than malignant neoplasm: Secondary | ICD-10-CM

## 2017-08-02 NOTE — Telephone Encounter (Signed)
Patient has disability paperwork that he needs filled out, patient has paid the 25.00 fee, patient would also like a copy for his records.

## 2017-08-02 NOTE — Telephone Encounter (Signed)
Patient's disability paperwork was filled out and faxed back to [employer] at this time with positive confirmation. Dates patient is to be out of work are 07/28/17-08/12/17 and may return on 08/13/17 with lifting restrictions of no more than 15 lbs until 09/08/17 when patient may resume normal activities without restrictions. Paperwork placed in scan folder after confirmation was received.

## 2017-08-13 ENCOUNTER — Encounter: Payer: Self-pay | Admitting: Surgery

## 2017-08-13 ENCOUNTER — Ambulatory Visit (INDEPENDENT_AMBULATORY_CARE_PROVIDER_SITE_OTHER): Payer: BLUE CROSS/BLUE SHIELD | Admitting: Surgery

## 2017-08-13 VITALS — BP 110/78 | HR 80 | Temp 98.3°F | Ht 69.0 in | Wt 189.0 lb

## 2017-08-13 DIAGNOSIS — Z09 Encounter for follow-up examination after completed treatment for conditions other than malignant neoplasm: Secondary | ICD-10-CM

## 2017-08-13 NOTE — Progress Notes (Signed)
08/13/2017  HPI: Patient is status post laparoscopic appendectomy with Dr. Katrinka BlazingSmith on 10/13 for acute appendicitis.  He presents today for postop follow-up.  He reports he has been doing well.  He does report intermittent cramping on the right lower quadrant but no significant pain and nothing that he has required pain medication for.  Denies any fevers or chills.  Reports normal appetite and normal bowel function.  Denies any issues with the incisions themselves.  Vital signs: BP 110/78   Pulse 80   Temp 98.3 F (36.8 C) (Oral)   Ht 5\' 9"  (1.753 m)   Wt 85.7 kg (189 lb)   BMI 27.91 kg/m    Physical Exam: Constitutional: No acute distress Abdomen: Soft, nondistended, nontender to palpation.  All 3 incisions are clean dry and intact with no evidence of infection.  Assessment/Plan: 41 year old male status post laparoscopic appendectomy.  -Reviewed pathology with the patient.  Appendicitis and periappendicitis with no malignancy. -Discussed with the patient he still has a no heavy lifting or pushing restriction until 11/24 over no more than 15 pounds. - Patient may shower and submerge his wounds in pool or tub -Discussed with the patient that its intermittent cramping may be related to postop healing and scarring but that this should get better and dissipate with time. -Patient will follow up with us on an as-needed basis   Howie IllJose Luis Vilda Zollner, MD [facility]

## 2017-08-13 NOTE — Patient Instructions (Signed)

## 2017-08-17 ENCOUNTER — Telehealth: Payer: Self-pay | Admitting: Surgery

## 2017-08-17 NOTE — Telephone Encounter (Signed)
Updated [employer] form has been fax with clinic notes to [phone].

## 2020-11-09 ENCOUNTER — Encounter: Payer: Self-pay | Admitting: Family Medicine

## 2020-11-09 ENCOUNTER — Ambulatory Visit (INDEPENDENT_AMBULATORY_CARE_PROVIDER_SITE_OTHER)
Admission: RE | Admit: 2020-11-09 | Discharge: 2020-11-09 | Disposition: A | Discharge status: Home / Self Care | Payer: BC Managed Care – PPO | Source: Ambulatory Visit | Attending: Family Medicine | Admitting: Family Medicine

## 2020-11-09 ENCOUNTER — Ambulatory Visit (INDEPENDENT_AMBULATORY_CARE_PROVIDER_SITE_OTHER): Payer: BC Managed Care – PPO | Admitting: Family Medicine

## 2020-11-09 ENCOUNTER — Other Ambulatory Visit: Payer: Self-pay

## 2020-11-09 VITALS — BP 118/62 | HR 82 | Temp 97.0°F | Ht 69.0 in | Wt 192.0 lb

## 2020-11-09 DIAGNOSIS — M542 Cervicalgia: Secondary | ICD-10-CM | POA: Diagnosis not present

## 2020-11-09 DIAGNOSIS — M5412 Radiculopathy, cervical region: Secondary | ICD-10-CM | POA: Diagnosis not present

## 2020-11-09 DIAGNOSIS — M5410 Radiculopathy, site unspecified: Secondary | ICD-10-CM | POA: Insufficient documentation

## 2020-11-09 MED ORDER — PREDNISONE 10 MG PO TABS: ORAL_TABLET | ORAL | 0 refills | Status: DC

## 2020-11-09 NOTE — Progress Notes (Signed)
Subjective:    Patient ID: Henry [name], male    DOB: 1976/05/06, 45 y.o.   MRN: [id number]  This visit occurred during the SARS-CoV-2 public health emergency.  Safety protocols were in place, including screening questions prior to the visit, additional usage of staff PPE, and extensive cleaning of exam room while observing appropriate contact time as indicated for disinfecting solutions.    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 Neck pain 11/09/2020  . Radiculopathy 11/09/2020  . Acute appendicitis 07/28/2017  . Cough 03/29/2017  . 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 Routine general medical examination at a health care facility 05/18/2013  . HEMORRHOIDS, EXTERNAL 05/13/2007   History reviewed. No pertinent past medical history. Past Surgical History:  Procedure Laterality Date  . BUNIONECTOMY    . HERNIA REPAIR    . LAPAROSCOPIC APPENDECTOMY N/A 07/28/2017   Procedure: APPENDECTOMY LAPAROSCOPIC;  Surgeon: Nadeen Landau, MD;  Location: [facility] ORS;  Service: General;  Laterality: N/A;  . TONSILLECTOMY     Social History    Tobacco Use  . Smoking status: Never Smoker  . Smokeless tobacco: Never Used  Substance Use Topics  . Alcohol use: Yes    Alcohol/week: 0.0 standard drinks    Comment: occasionally  . Drug use: No   History reviewed. No pertinent family history. No Known Allergies No current outpatient medications on file prior to visit.   No current facility-administered medications on file prior to visit.     Review of Systems  Constitutional: Negative for activity change, appetite change, fatigue, fever and unexpected weight change.  HENT: Negative for congestion, rhinorrhea, sore throat and trouble swallowing.   Eyes: Negative for pain, redness, itching and visual disturbance.  Respiratory: Negative for cough, chest tightness, shortness of breath and wheezing.   Cardiovascular: Negative for chest pain and palpitations.  Gastrointestinal: Negative for abdominal pain, blood in stool, constipation, diarrhea and nausea.  Endocrine: Negative for cold intolerance, heat intolerance, polydipsia and polyuria.  Genitourinary: Negative for difficulty urinating, dysuria, frequency and urgency.  Musculoskeletal: Positive for neck pain. Negative for arthralgias, joint swelling and myalgias.       Left arm pain  Skin: Negative for pallor and rash.  Neurological: Negative for dizziness, tremors, weakness, numbness and headaches.       Tingling L arm   Hematological: Negative for adenopathy. Does not bruise/bleed easily.  Psychiatric/Behavioral: Negative for decreased concentration and dysphoric mood. The  patient is not nervous/anxious.        Objective:   Physical Exam Constitutional:      General: He is not in acute distress.    Appearance: Normal appearance. He is normal weight. He is not ill-appearing.  Neck:     Vascular: No carotid bruit.  Cardiovascular:     Rate and Rhythm: Normal rate and regular rhythm.     Heart sounds: Normal heart sounds.  Pulmonary:     Breath sounds: Normal breath  sounds.  Musculoskeletal:     Cervical back: Normal range of motion. Spasms, tenderness and bony tenderness present. No deformity, signs of trauma or rigidity. Normal range of motion.     Comments: Lower CS tenderness  Some L cervical muscle tenderness and trapezius spasm Nl rom of neck -pain with full flex and R tilt  No crepitus   Nl rom of L shoulder/elbow and wrist w/o tenderness   Lymphadenopathy:     Cervical: No cervical adenopathy.  Neurological:     Mental Status: He is alert.     Motor: No weakness.     Deep Tendon Reflexes: Reflexes normal.     Comments: Pt reports tingling in L arm but sens to soft touch is preserved  Psychiatric:        Mood and Affect: Mood normal.           Assessment & Plan:   Problem List Items Addressed This Visit      Nervous and Auditory   Radiculopathy - Primary    L neck pain with cervical radiculopathy symptoms  Mild imp with ibuprofen Will check cs film and start prednisone 40 mg taper Disc poss side eff Recommend better pillow/position at night Also heat/ice intermittent Will update       Relevant Orders   DG Cervical Spine Complete (Completed)     Other   Neck pain   Relevant Orders   DG Cervical Spine Complete (Completed)

## 2020-11-09 NOTE — Patient Instructions (Addendum)
Try cold and warm compresses on your neck   Elevate the head of your bed with a wedge instead of trying to lift head with just pillows Get a cervical support pillow   Cervical spine film today  We will contact you with a result when it is read   Take prednisone as directed  It will make you hyper and hungry when you are on it   Stop the ibuprofen  Tylenol only (acetaminophen)

## 2020-11-10 NOTE — Assessment & Plan Note (Signed)
L neck pain with cervical radiculopathy symptoms  Mild imp with ibuprofen Will check cs film and start prednisone 40 mg taper Disc poss side eff Recommend better pillow/position at night Also heat/ice intermittent Will update

## 2020-11-12 ENCOUNTER — Telehealth: Payer: Self-pay | Admitting: *Deleted

## 2020-11-12 DIAGNOSIS — M542 Cervicalgia: Secondary | ICD-10-CM

## 2020-11-12 DIAGNOSIS — M5412 Radiculopathy, cervical region: Secondary | ICD-10-CM

## 2020-11-12 NOTE — Telephone Encounter (Signed)
-----   Message from Judy Pimple, MD sent at 11/10/2020  7:26 AM EST ----- There is mild degenerative change in CS-this may cause a pinched nerve  Please take the prednisone and let me know on Friday how symptoms are (earlier if worse)  We will make a further plan based on progress

## 2020-11-12 NOTE — Telephone Encounter (Signed)
Called pt and no answer and pt's VM isn't set up so no message left

## 2020-11-12 NOTE — Telephone Encounter (Signed)
Pt notified of Dr. Royden Purl recommendations and he agrees with MRI, pt would like to have it done in [place] if possible. I advise pt PCP would put order in and our [facility] will call to schedule appt once it's approved by insurance

## 2020-11-12 NOTE — Telephone Encounter (Signed)
Pt notified of xray results and Dr. Royden Purl comments. Pt said that the pain has improved only mildly and he is still having the pain, numbness in his fingers and can have pain anywhere in his left arm, elbow, shoulders or hands during the day. Pt said the prednisone hasn't helped as much as he thought it was going to

## 2020-11-12 NOTE — Telephone Encounter (Signed)
Patient called back. Please call him back. EM 

## 2020-11-12 NOTE — Telephone Encounter (Signed)
I want to order an MRI of his CS Is that ok?  What area does he prefer if we have a choice?

## 2020-11-13 NOTE — Addendum Note (Signed)
Addended by: Roxy Manns A on: 11/13/2020 05:11 PM   Modules accepted: Orders

## 2020-11-21 ENCOUNTER — Other Ambulatory Visit: Payer: Self-pay

## 2020-11-21 ENCOUNTER — Ambulatory Visit
Admission: RE | Admit: 2020-11-21 | Discharge: 2020-11-21 | Disposition: A | Discharge status: Home / Self Care | Payer: BC Managed Care – PPO | Source: Ambulatory Visit | Attending: Family Medicine | Admitting: Family Medicine

## 2020-11-21 DIAGNOSIS — M542 Cervicalgia: Secondary | ICD-10-CM | POA: Diagnosis present

## 2020-11-21 DIAGNOSIS — M5412 Radiculopathy, cervical region: Secondary | ICD-10-CM | POA: Diagnosis present

## 2020-11-22 ENCOUNTER — Telehealth: Payer: Self-pay | Admitting: Family Medicine

## 2020-11-22 DIAGNOSIS — M5412 Radiculopathy, cervical region: Secondary | ICD-10-CM

## 2020-11-22 NOTE — Telephone Encounter (Signed)
-----   Message from Shon Millet, [place] sent at 11/22/2020 12:56 PM EST ----- Pt notified of Dr. Royden Purl comments regarding MRI, pt said he still doesn't understand what that means, he was able to view MRI results also on mychart and said they "are confusing" ?? If Dr. Milinda Antis call and review it with him or is this something he has to wait to see ortho to get an understanding of MRI. Pt agrees with Ortho referral and his preference would be [facility]

## 2020-11-22 NOTE — Telephone Encounter (Signed)
I agree-the wording is confusing  Simplest explanation is that he has a pinched nerve in neck causing the arm/hand symptoms   The orthopedic provider will be able to explain better   Referral is in  Thanks

## 2021-01-06 ENCOUNTER — Ambulatory Visit: Payer: BC Managed Care – PPO | Admitting: Family Medicine

## 2021-01-07 ENCOUNTER — Other Ambulatory Visit: Payer: Self-pay

## 2021-01-07 ENCOUNTER — Telehealth: Payer: Self-pay

## 2021-01-07 ENCOUNTER — Ambulatory Visit (INDEPENDENT_AMBULATORY_CARE_PROVIDER_SITE_OTHER)
Admission: RE | Admit: 2021-01-07 | Discharge: 2021-01-07 | Disposition: A | Discharge status: Home / Self Care | Payer: BC Managed Care – PPO | Source: Ambulatory Visit | Attending: Internal Medicine | Admitting: Internal Medicine

## 2021-01-07 ENCOUNTER — Ambulatory Visit: Payer: BC Managed Care – PPO | Admitting: Internal Medicine

## 2021-01-07 ENCOUNTER — Encounter: Payer: Self-pay | Admitting: Internal Medicine

## 2021-01-07 DIAGNOSIS — M79641 Pain in right hand: Secondary | ICD-10-CM | POA: Insufficient documentation

## 2021-01-07 DIAGNOSIS — S62306A Unspecified fracture of fifth metacarpal bone, right hand, initial encounter for closed fracture: Secondary | ICD-10-CM | POA: Diagnosis not present

## 2021-01-07 DIAGNOSIS — S62308A Unspecified fracture of other metacarpal bone, initial encounter for closed fracture: Secondary | ICD-10-CM | POA: Insufficient documentation

## 2021-01-07 NOTE — Telephone Encounter (Signed)
Yes---I discussed the results with him and will have him set up with Dr Patsy Lager for appropriate immobilzation

## 2021-01-07 NOTE — Telephone Encounter (Signed)
Narrative & Impression  CLINICAL DATA:  Pain following recent trauma  EXAM: RIGHT HAND - COMPLETE 3+ VIEW  COMPARISON:  None.  FINDINGS: Frontal, oblique, and lateral views were obtained. There is a comminuted fracture of the distal aspect of the fifth metacarpal with volar angulation distally. No other fracture. No dislocation. No appreciable joint space narrowing or erosion. There is soft tissue swelling medially.  IMPRESSION: Comminuted fracture distal fifth metacarpal with volar angulation distally. No other fracture. No dislocation. No arthropathy.  These results will be called to the ordering clinician or representative by the Radiologist Assistant, and communication documented in the PACS or [employer].   Electronically Signed   By: Bretta Bang III M.D.   On: 01/07/2021 09:21
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
## 2021-01-07 NOTE — Assessment & Plan Note (Signed)
Is able to extend 5th finger pretty fully and flex fairly well Will hold off on surgical evaluation ACE wrap for now  Set up with Dr Patsy Lager on Monday for better immobilzation

## 2021-01-07 NOTE — Assessment & Plan Note (Signed)
Findings suggestive of fracture at 5th MCP (probably the metacarpal). Will check x-ray

## 2021-01-10 ENCOUNTER — Ambulatory Visit: Payer: BC Managed Care – PPO | Admitting: Family Medicine

## 2021-01-10 ENCOUNTER — Other Ambulatory Visit: Payer: Self-pay

## 2021-01-10 ENCOUNTER — Encounter: Payer: Self-pay | Admitting: Family Medicine

## 2021-01-10 VITALS — BP 120/70 | HR 61 | Temp 97.9°F | Ht 69.0 in | Wt 195.2 lb

## 2021-01-10 DIAGNOSIS — T148XXA Other injury of unspecified body region, initial encounter: Secondary | ICD-10-CM | POA: Diagnosis not present

## 2021-01-10 DIAGNOSIS — S62306A Unspecified fracture of fifth metacarpal bone, right hand, initial encounter for closed fracture: Secondary | ICD-10-CM

## 2021-01-10 NOTE — Progress Notes (Signed)
Henry Simko T. Savera Donson, MD, CAQ Sports Medicine  Primary Care and Sports Medicine [facility] [address] [place], [address]  Phone: [phone]  FAX: [phone]  CROSS JORGE [name] - 45 y.o. male  MRN [id number]  Date of Birth: September 09, 1976  Date: 01/10/2021  PCP: Judy Pimple, MD  Referral: Judy Pimple, MD  Chief Complaint  Patient presents with  . Follow-up    Fx 5th metacarpal right hand per Dr. Alphonsus Sias    This visit occurred during the SARS-CoV-2 public health emergency.  Safety protocols were in place, including screening questions prior to the visit, additional usage of staff PPE, and extensive cleaning of exam room while observing appropriate contact time as indicated for disinfecting solutions.   Subjective:   Henry [name] is a 45 y.o. very pleasant male patient with Body mass index is 28.83 kg/m. who presents with the following:  This is in follow-up after he punched steel pole about 10 to 14 days ago, and he had some continued swelling and bruising.  That time he was having some difficulty using his finger and had some significant pain and swelling.  He was having difficulty even doing some basic basketball passes, and shooting caused him a great deal of pain.  He saw my partner Dr. Alphonsus Sias last Friday, and his hand and wrist were wrapped in an Ace bandage.  I did review his x-rays myself and with him in the office.  He has 1/5 metacarpal fracture that is significantly comminuted with an apex dorsal fracture with significant volar angulation.  Review of Systems is noted in the HPI, as appropriate   Objective:   BP 120/70   Pulse 61   Temp 97.9 F (36.6 C) (Temporal)   Ht 5\' 9"  (1.753 m)   Wt 195 lb 4 oz (88.6 kg)   SpO2 98%   BMI 28.83 kg/m   GEN: No acute distress; alert,appropriate. PULM: Breathing comfortably in no respiratory distress PSYCH: Normally interactive.    He has tenderness around anywhere  his fifth metacarpal without tenderness in the surrounding metacarpals or wrist.  He is unable to grip at all with the fifth digit.  He is unable to close this digit unassisted.  He is able to have extension, extension and flexor function does appear to be intact.  Radiology: DG Hand Complete Right  Result Date: 01/07/2021 CLINICAL DATA:  Pain following recent trauma EXAM: RIGHT HAND - COMPLETE 3+ VIEW COMPARISON:  None. FINDINGS: Frontal, oblique, and lateral views were obtained. There is a comminuted fracture of the distal aspect of the fifth metacarpal with volar angulation distally. No other fracture. No dislocation. No appreciable joint space narrowing or erosion. There is soft tissue swelling medially. IMPRESSION: Comminuted fracture distal fifth metacarpal with volar angulation distally. No other fracture. No dislocation. No arthropathy. These results will be called to the ordering clinician or representative by the Radiologist Assistant, and communication documented in the PACS or 01/09/2021. Electronically Signed   By: [employer] III M.D.   On: 01/07/2021 09:21    Assessment and Plan:     ICD-10-CM   1. Closed displaced fracture of fifth metacarpal bone of right hand, unspecified portion of metacarpal, initial encounter  S62.306A Ambulatory referral to Orthopedic Surgery  2. Comminuted fracture  T14.8XXA Ambulatory referral to Orthopedic Surgery   He has a significant comminuted fracture at the fifth metacarpal head with some significant volar angulation.  On my  review, it does look like he has multiple nondisplaced fracture fragments at the fracture sites.  In this case, really would prefer for orthopedic surgery versus hand surgery to longitudinally manage this case.  I did place him in a ulnar gutter splint for stabilization, and we will arrange for orthopedic follow-up.  I greatly appreciate their assistance.   Social: This is significantly going to inhibit his  ability to use his upper extremities for any form of exercise, and will decrease his writing function.  Orders Placed This Encounter  Procedures  . Ambulatory referral to Orthopedic Surgery    Signed,  Karleen Hampshire T. Coran Dipaola, MD   Outpatient Encounter Medications as of 01/10/2021  Medication Sig  . Ibuprofen 200 MG CAPS Take by mouth.   No facility-administered encounter medications on file as of 01/10/2021.

## 2021-08-10 ENCOUNTER — Emergency Department: Payer: BC Managed Care – PPO

## 2021-08-10 ENCOUNTER — Other Ambulatory Visit: Payer: Self-pay

## 2021-08-10 DIAGNOSIS — N132 Hydronephrosis with renal and ureteral calculous obstruction: Secondary | ICD-10-CM | POA: Diagnosis not present

## 2021-08-10 DIAGNOSIS — R109 Unspecified abdominal pain: Secondary | ICD-10-CM | POA: Diagnosis present

## 2021-08-10 LAB — CBC
HCT: 46 % (ref 39.0–52.0)
Hemoglobin: 16.4 g/dL (ref 13.0–17.0)
MCH: 31.8 pg (ref 26.0–34.0)
MCHC: 35.7 g/dL (ref 30.0–36.0)
MCV: 89.3 fL (ref 80.0–100.0)
Platelets: 292 10*3/uL (ref 150–400)
RBC: 5.15 MIL/uL (ref 4.22–5.81)
RDW: 12.1 % (ref 11.5–15.5)
WBC: 8.3 10*3/uL (ref 4.0–10.5)
nRBC: 0 % (ref 0.0–0.2)

## 2021-08-10 LAB — URINALYSIS, COMPLETE (UACMP) WITH MICROSCOPIC
Bilirubin Urine: NEGATIVE
Glucose, UA: NEGATIVE mg/dL
Ketones, ur: 5 mg/dL — AB
Leukocytes,Ua: NEGATIVE
Nitrite: NEGATIVE
Protein, ur: 100 mg/dL — AB
RBC / HPF: >50 RBC/hpf — ABNORMAL HIGH (ref 0–5)
Specific Gravity, Urine: 1.033 — ABNORMAL HIGH (ref 1.005–1.030)
pH: 5 (ref 5.0–8.0)

## 2021-08-10 LAB — BASIC METABOLIC PANEL
Anion gap: 10 (ref 5–15)
BUN: 14 mg/dL (ref 6–20)
CO2: 21 mmol/L — ABNORMAL LOW (ref 22–32)
Calcium: 9.3 mg/dL (ref 8.9–10.3)
Chloride: 105 mmol/L (ref 98–111)
Creatinine, Ser: 1.28 mg/dL — ABNORMAL HIGH (ref 0.61–1.24)
GFR, Estimated: >60 mL/min (ref >60)
Glucose, Bld: 90 mg/dL (ref 70–99)
Potassium: 3.9 mmol/L (ref 3.5–5.1)
Sodium: 136 mmol/L (ref 135–145)

## 2021-08-10 NOTE — ED Triage Notes (Addendum)
Pt comes pov with right flank pain starting today. Has had appendectomy. States blood in urine today. Pain is coming and going.

## 2021-08-11 ENCOUNTER — Emergency Department
Admission: EM | Admit: 2021-08-11 | Discharge: 2021-08-11 | Disposition: A | Discharge status: Home / Self Care | Payer: BC Managed Care – PPO | Attending: Emergency Medicine | Admitting: Emergency Medicine

## 2021-08-11 DIAGNOSIS — R319 Hematuria, unspecified: Secondary | ICD-10-CM

## 2021-08-11 DIAGNOSIS — N201 Calculus of ureter: Secondary | ICD-10-CM

## 2021-08-11 DIAGNOSIS — R109 Unspecified abdominal pain: Secondary | ICD-10-CM

## 2021-08-11 MED ORDER — TAMSULOSIN HCL 0.4 MG PO CAPS: 0.4000 mg | ORAL_CAPSULE | Freq: Every day | ORAL | 0 refills | Status: AC

## 2021-08-11 NOTE — ED Provider Notes (Signed)
[facility] Emergency Department Provider Note   ____________________________________________   None    (approximate)  I have reviewed the triage vital signs and the nursing notes.   HISTORY  Chief Complaint Flank Pain    HPI Henry [name] is a 45 y.o. male who presents for right-sided flank pain  LOCATION: Right flank DURATION: 12 hours prior to arrival TIMING: Intermittent and partially resolved since onset SEVERITY: Severe QUALITY: Sharp pain/pressure CONTEXT: Patient states that when he woke this morning he felt some slight pressure in the right flank but eventually worsened into severe 10/10 sharp pain that radiated into his groin.  As patient was in our waiting room, he states that the pain has almost completely resolved and he just feels a pressure-like sensation in his right flank MODIFYING FACTORS: Denies any exacerbating or relieving factors ASSOCIATED SYMPTOMS: Nausea   Per medical record review, patient has history of hyperlipidemia          History reviewed. No pertinent past medical history.  Patient Active Problem List   Diagnosis Date Noted   Right hand pain 01/07/2021   Closed fracture of 5th metacarpal 01/07/2021   Neck pain 11/09/2020   Cervical radiculopathy 11/09/2020   Acute appendicitis 07/28/2017   Cough 03/29/2017   Hyperlipidemia, mild 05/26/2013   Routine general medical examination at a health care facility 05/18/2013   HEMORRHOIDS, EXTERNAL 05/13/2007    Past Surgical History:  Procedure Laterality Date   BUNIONECTOMY     HERNIA REPAIR     LAPAROSCOPIC APPENDECTOMY N/A 07/28/2017   Procedure: APPENDECTOMY LAPAROSCOPIC;  Surgeon: Nadeen Landau, MD;  Location: [facility] ORS;  Service: General;  Laterality: N/A;   TONSILLECTOMY      Prior to Admission medications   Medication Sig Start Date End Date Taking? Authorizing Provider  tamsulosin (FLOMAX) 0.4 MG CAPS capsule Take 1 capsule (0.4 mg  total) by mouth daily for 7 days. 08/11/21 08/18/21 Yes Merwyn Katos, MD  Ibuprofen 200 MG CAPS Take by mouth.    [provider]    Allergies Patient has no known allergies.  History reviewed. No pertinent family history.  Social History Social History   Tobacco Use   Smoking status: Never   Smokeless tobacco: Never  Substance Use Topics   Alcohol use: Yes    Alcohol/week: 0.0 standard drinks    Comment: occasionally   Drug use: No    Review of Systems Constitutional: No fever/chills Eyes: No visual changes. ENT: No sore throat. Cardiovascular: Denies chest pain. Respiratory: Denies shortness of breath. Gastrointestinal: Endorses right flank pain and nausea.  No vomiting.  No diarrhea. Genitourinary: Negative for dysuria. Musculoskeletal: Negative for acute arthralgias Skin: Negative for rash. Neurological: Negative for headaches, weakness/numbness/paresthesias in any extremity Psychiatric: Negative for suicidal ideation/homicidal ideation   ____________________________________________   PHYSICAL EXAM:  VITAL SIGNS: ED Triage Vitals  Enc Vitals Group     BP 08/10/21 1833 (!) 153/99     Pulse Rate 08/10/21 1833 61     Resp 08/10/21 1833 18     Temp 08/10/21 1833 98 F (36.7 C)     Temp Source 08/10/21 1833 Oral     SpO2 08/10/21 1833 100 %     Weight 08/10/21 1834 195 lb (88.5 kg)     Height 08/10/21 1834 5\' 9"  (1.753 m)     Head Circumference --      Peak Flow --      Pain Score 08/10/21 1834 2  Pain Loc --      Pain Edu? --      Excl. in GC? --    Constitutional: Alert and oriented. Well appearing and in no acute distress. Eyes: Conjunctivae are normal. PERRL. Head: Atraumatic. Nose: No congestion/rhinnorhea. Mouth/Throat: Mucous membranes are moist. Neck: No stridor Cardiovascular: Grossly normal heart sounds.  Good peripheral circulation. Respiratory: Normal respiratory effort.  No retractions. Gastrointestinal: Soft and nontender.  No distention. Musculoskeletal: No obvious deformities Neurologic:  Normal speech and language. No gross focal neurologic deficits are appreciated. Skin:  Skin is warm and dry. No rash noted. Psychiatric: Mood and affect are normal. Speech and behavior are normal.  ____________________________________________   LABS (all labs ordered are listed, but only abnormal results are displayed)  Labs Reviewed  BASIC METABOLIC PANEL - Abnormal; Notable for the following components:      Result Value   CO2 21 (*)    Creatinine, Ser 1.28 (*)    All other components within normal limits  URINALYSIS, COMPLETE (UACMP) WITH MICROSCOPIC - Abnormal; Notable for the following components:   Color, Urine AMBER (*)    APPearance CLOUDY (*)    Specific Gravity, Urine 1.033 (*)    Hgb urine dipstick LARGE (*)    Ketones, ur 5 (*)    Protein, ur 100 (*)    RBC / HPF >50 (*)    Bacteria, UA FEW (*)    All other components within normal limits  CBC     RADIOLOGY  ED MD interpretation: CT of the abdomen and pelvis without IV contrast shows a recently passed 3 mm right renal calculus as well as a 2 mm nonobstructing renal stone within the lower pole of the right kidney  Official radiology report(s): CT Renal Stone Study  Result Date: 08/10/2021 CLINICAL DATA:  Right flank pain. EXAM: CT ABDOMEN AND PELVIS WITHOUT CONTRAST TECHNIQUE: Multidetector CT imaging of the abdomen and pelvis was performed following the standard protocol without IV contrast. COMPARISON:  July 28, 2017 FINDINGS: Lower chest: No acute abnormality. Hepatobiliary: No focal liver abnormality is seen. No gallstones, gallbladder wall thickening, or biliary dilatation. Pancreas: Unremarkable. No pancreatic ductal dilatation or surrounding inflammatory changes. Spleen: Normal in size without focal abnormality. Adrenals/Urinary Tract: Adrenal glands are unremarkable. Kidneys are normal in size, without focal lesions. A 2 mm nonobstructing  renal stone is seen within the lower pole of the right kidney. There is mild right-sided hydronephrosis and hydroureter. A 3 mm calcification is seen within the dependent portion of a partially contracted urinary bladder. Stomach/Bowel: Stomach is within normal limits. The appendix is surgically absent. No evidence of bowel wall thickening, distention, or inflammatory changes. Vascular/Lymphatic: No significant vascular findings are present. No enlarged abdominal or pelvic lymph nodes. Reproductive: The prostate gland is mildly enlarged. Other: A 3.1 cm x 2.6 cm fat containing left inguinal hernia is noted. No abdominopelvic ascites. Musculoskeletal: No acute or significant osseous findings. IMPRESSION: 1. Findings consistent with a recently passed 3 mm right renal calculus. 2. 2 mm nonobstructing renal stone within the lower pole of the right kidney. 3. Fat containing left inguinal hernia. Electronically Signed   By: Aram Candela M.D.   On: 08/10/2021 19:21    ____________________________________________   PROCEDURES  Procedure(s) performed (including Critical Care):  Procedures   ____________________________________________   INITIAL IMPRESSION / ASSESSMENT AND PLAN / ED COURSE  As part of my medical decision making, I reviewed the following data within the electronic medical record, if available:  Nursing  notes reviewed and incorporated, Labs reviewed, EKG interpreted, Old chart reviewed, Radiograph reviewed and Notes from prior ED visits reviewed and incorporated        Patient presents for severe flank pain. Presentation most consistent with Renal Colic from a Non-infected Kidney Stone. Given History and Exam I have lower suspicion for atypical appendicitis, genital torsion, acute cholecystitis, AAA, Aortic Dissection, Serious Bacterial Illness or other emergent intraabdominal pathology.  Workup: CBC, BMP, CT Abd/Pelvis noncontrast, UA, reassess Findings: Recently passed 3 mm  kidney stone with a 2 mm stone in the right kidney UA showing hematuria Reassesment: Patient tolerating PO and pain controlled Disposition:  Discharge. Strict return precautions for infected stone or PO intolerance discussed.      ____________________________________________   FINAL CLINICAL IMPRESSION(S) / ED DIAGNOSES  Final diagnoses:  Right flank pain  Ureterolithiasis  Hematuria, unspecified type     ED Discharge Orders          Ordered    tamsulosin (FLOMAX) 0.4 MG CAPS capsule  Daily        08/11/21 0043             Note:  This document was prepared using Dragon voice recognition software and may include unintentional dictation errors.    Merwyn Katos, MD 08/11/21 [phone]

## 2022-06-24 IMAGING — CT CT RENAL STONE PROTOCOL
2 of 4 series · 16 of 46 positions shown, 18 images · non-contrast
Comparison: July 28, 2017

CLINICAL DATA: Right flank pain.

EXAM:
CT ABDOMEN AND PELVIS WITHOUT CONTRAST
TECHNIQUE: Multidetector CT imaging of the abdomen and pelvis was performed
following the standard protocol without IV contrast.

[Series 2: stone full standard · axial · 0.71mm/px · z∈[-575,-140]mm · 13 of 97 slices shown, 15 images]
[im 5/97  soft-tissue]
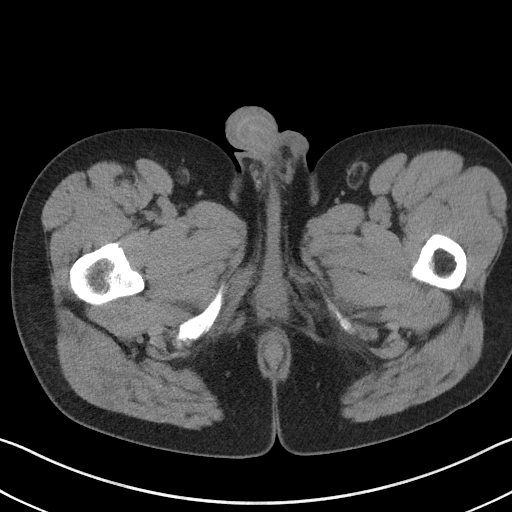
[im 5/97  bone]
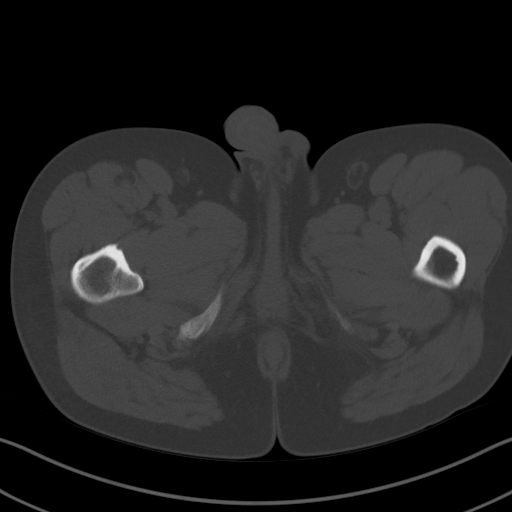
[im 14/97  soft-tissue]
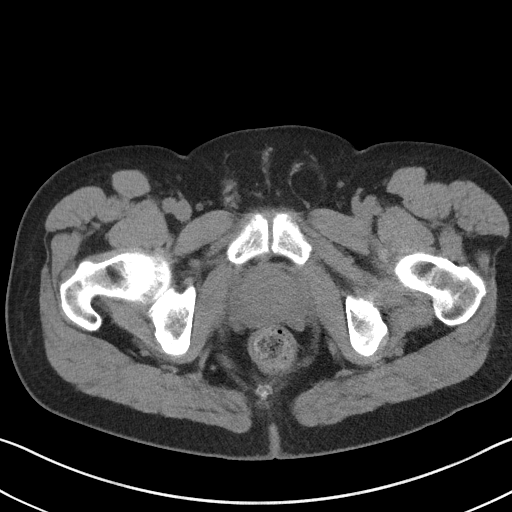
[im 22/97  soft-tissue]
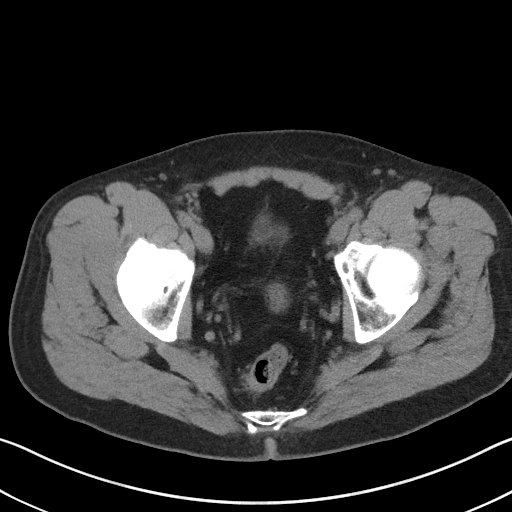
[im 27/97  soft-tissue]
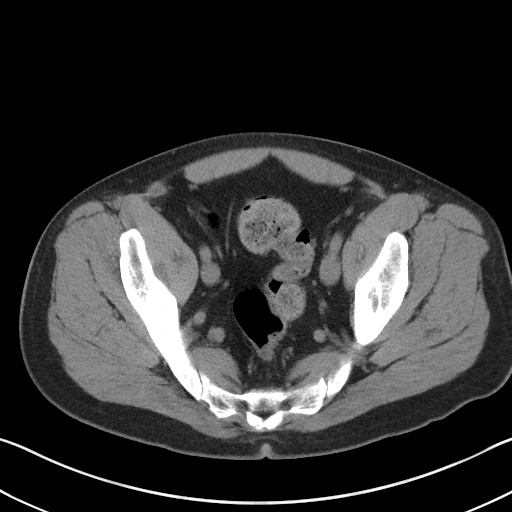
[im 35/97  soft-tissue]
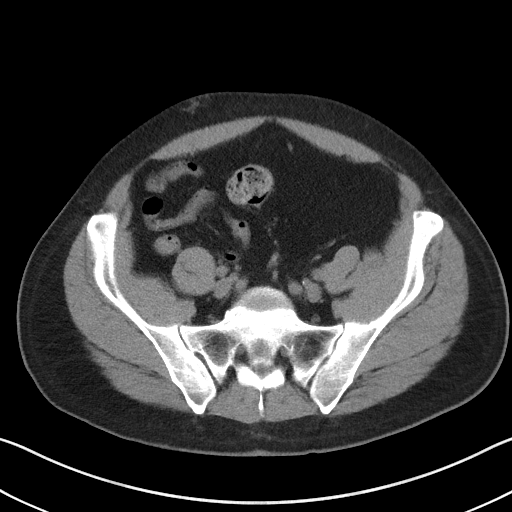
[im 40/97  soft-tissue]
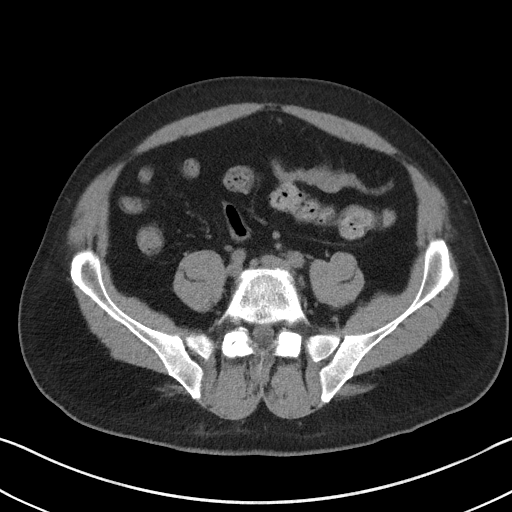
[im 49/97  soft-tissue]
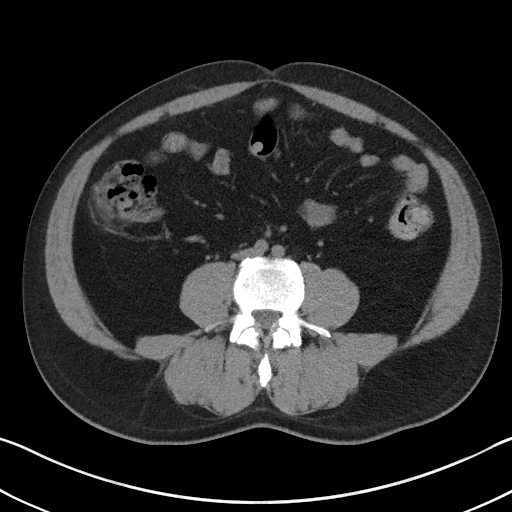
[im 57/97  soft-tissue]
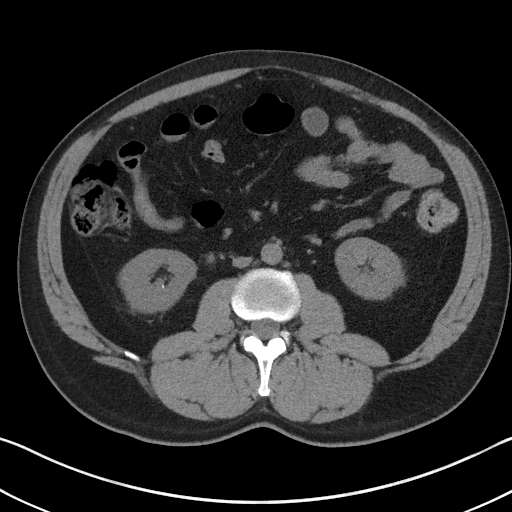
[im 62/97  soft-tissue]
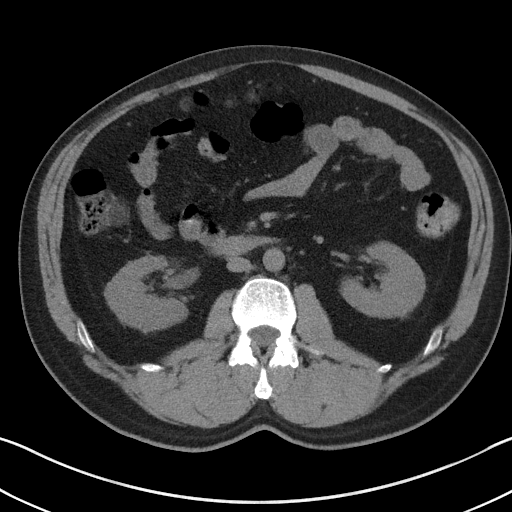
[im 62/97  bone]
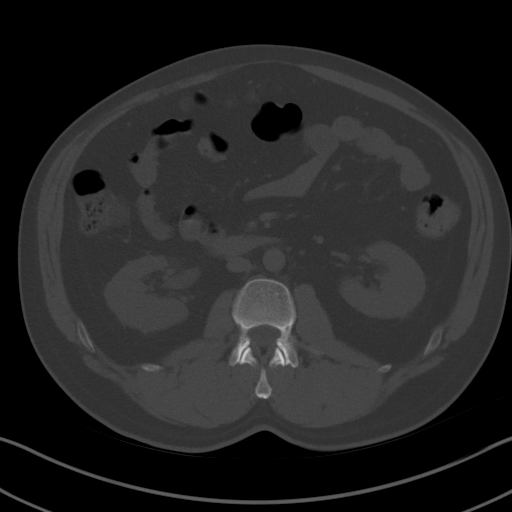
[im 70/97  soft-tissue]
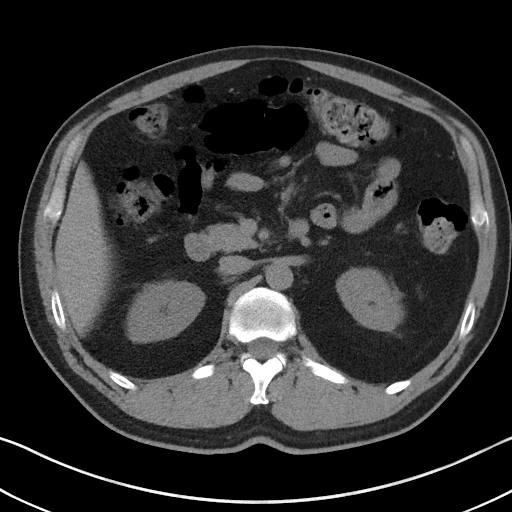
[im 75/97  soft-tissue]
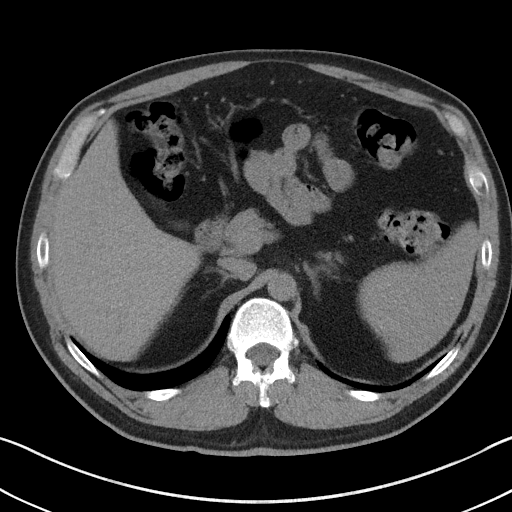
[im 83/97  soft-tissue]
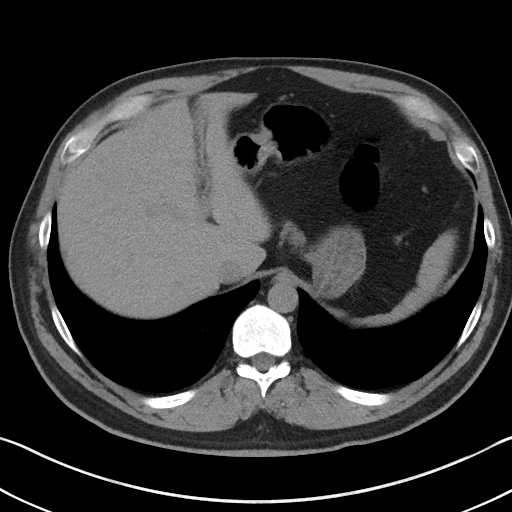
[im 92/97  soft-tissue]
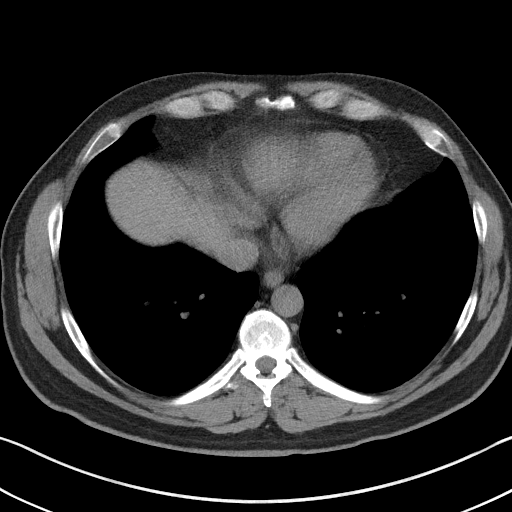

[Series 5: coronal · coronal · 0.82mm/px · 3 of 140 slices shown]
[im 47/140  soft-tissue]
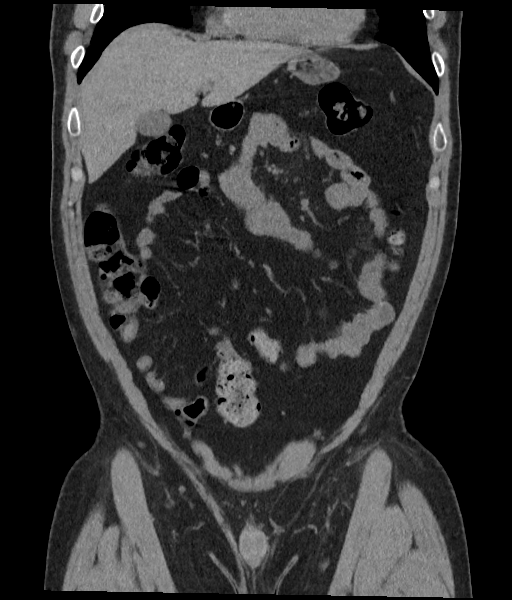
[im 62/140  soft-tissue]
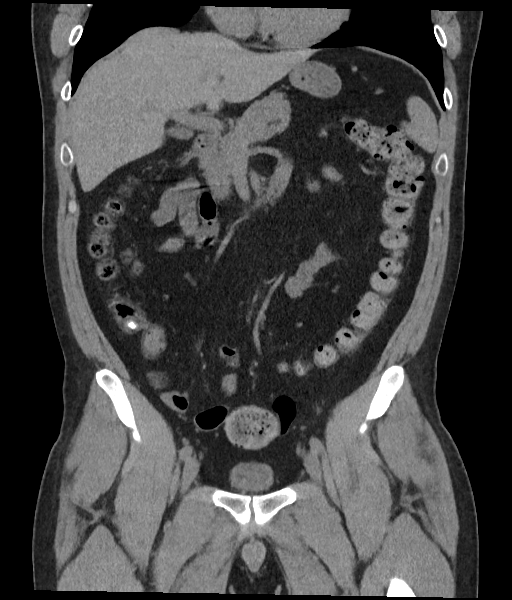
[im 78/140  soft-tissue]
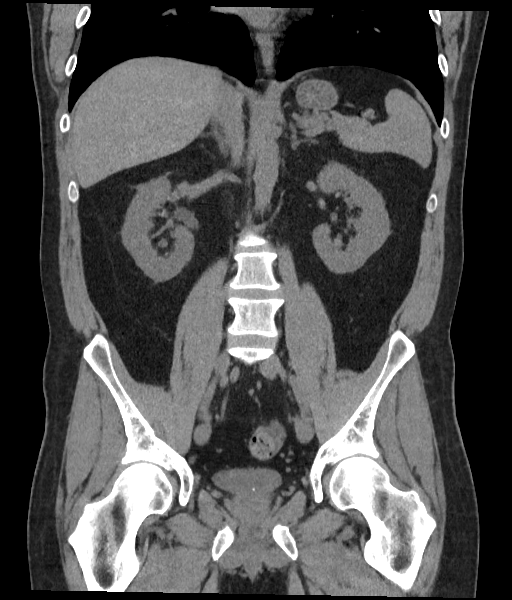

[16 of 46 positions shown; findings below may reference images not displayed]

FINDINGS: Lower chest: No acute abnormality.

Hepatobiliary: No focal liver abnormality is seen. No gallstones,
gallbladder wall thickening, or biliary dilatation.

Pancreas: Unremarkable. No pancreatic ductal dilatation or
surrounding inflammatory changes.

Spleen: Normal in size without focal abnormality.

Adrenals/Urinary Tract: Adrenal glands are unremarkable. Kidneys are
normal in size, without focal lesions. A 2 mm nonobstructing renal
stone is seen within the lower pole of the right kidney. There is
mild right-sided hydronephrosis and hydroureter. A 3 mm
calcification is seen within the dependent portion of a partially
contracted urinary bladder.

Stomach/Bowel: Stomach is within normal limits. The appendix is
surgically absent. No evidence of bowel wall thickening, distention,
or inflammatory changes.

Vascular/Lymphatic: No significant vascular findings are present. No
enlarged abdominal or pelvic lymph nodes.

Reproductive: The prostate gland is mildly enlarged.

Other: A 3.1 cm x 2.6 cm fat containing left inguinal hernia is
noted. No abdominopelvic ascites.

Musculoskeletal: No acute or significant osseous findings.
IMPRESSION: 1. Findings consistent with a recently passed 3 mm right renal
calculus.
2. 2 mm nonobstructing renal stone within the lower pole of the
right kidney.
3. Fat containing left inguinal hernia.
